# Patient Record
Sex: Female | Born: 1985 | Race: Black or African American | Hispanic: No | Marital: Single | Smoking: Never smoker
Health system: Southern US, Community
[De-identification: ages and names within clinical notes are randomized; demographics above are authoritative.]

---

## 2009-05-15 ENCOUNTER — Ambulatory Visit: Payer: Self-pay | Admitting: Obstetrics & Gynecology

## 2009-05-15 ENCOUNTER — Encounter: Payer: Self-pay | Admitting: Obstetrics & Gynecology

## 2009-05-16 ENCOUNTER — Encounter: Payer: Self-pay | Admitting: Obstetrics & Gynecology

## 2009-05-16 LAB — CONVERTED CEMR LAB

## 2009-05-21 ENCOUNTER — Ambulatory Visit: Payer: Self-pay | Admitting: Obstetrics & Gynecology

## 2009-05-21 LAB — CONVERTED CEMR LAB
ALT: 17 units/L (ref 0–35)
CO2: 19 meq/L (ref 19–32)
Calcium: 9 mg/dL (ref 8.4–10.5)
Chloride: 107 meq/L (ref 96–112)
Cholesterol: 128 mg/dL (ref 0–200)
Creatinine, Ser: 0.88 mg/dL (ref 0.40–1.20)
Glucose, Bld: 73 mg/dL (ref 70–99)
HCT: 39.1 % (ref 36.0–46.0)
Hemoglobin: 12.8 g/dL (ref 12.0–15.0)
Platelets: 259 10*3/uL (ref 150–400)
RBC: 4.23 M/uL (ref 3.87–5.11)
Total CHOL/HDL Ratio: 2.5
Triglycerides: 66 mg/dL (ref <150)
WBC: 7.4 10*3/uL (ref 4.0–10.5)

## 2009-07-18 ENCOUNTER — Ambulatory Visit: Payer: Self-pay | Admitting: Obstetrics & Gynecology

## 2009-07-24 ENCOUNTER — Ambulatory Visit: Payer: Self-pay | Admitting: Obstetrics & Gynecology

## 2009-10-29 ENCOUNTER — Ambulatory Visit: Payer: Self-pay | Admitting: Obstetrics and Gynecology

## 2011-01-07 NOTE — Assessment & Plan Note (Signed)
NAME:  Bailey Mendoza, Bailey Mendoza NO.:  [id number]   MEDICAL RECORD NO.:  [id number]          PATIENT TYPE:  POB   LOCATION:  [facility] at [facility]         FACILITY:  [facility]   PHYSICIAN:  Jaynie Collins, MD     DATE OF BIRTH:  [birth year]   DATE OF SERVICE:  05/15/2009                                  CLINIC NOTE   HISTORY OF PRESENT ILLNESS:  The patient is a 25 year old gravida 0, the  last menstrual period of April 25, 2009, who was here to initiate  gynecologic care and have her annual physical examination and Pap smear.  The patient does complain of having thick white vaginal discharge for  the last couple of weeks with bad odor and itching.  She denies any  dysuria, bleeding, fevers, or any other symptoms.  The patient denies  any history of sexually transmitted infection.  She is sexually active  with one female partner and uses condoms for contraception and sexually  transmitted infection prevention.  The patient is interested in starting  hormonal contraception and wants to discuss her options today.  She  denies any other gynecologic concerns.   PAST OBSTETRIC/GYNECOLOGIC HISTORY:  Gravida 0, the patient had menarche  at age 25.  She has regular menstrual cycles with 28 days in between  cycles and her periods last for 3 days with medium flow associated with  some moderate pain.  She denies any intramenstrual bleeding.  The  patient has had a Pap smear in March 2008, which was normal.  She denies  any history of abnormal Pap smear.  The patient has taken Ortho Tri-  Cyclen in the past for contraception, the last time was 7-8 months ago  and she did not have any reaction to this or any adverse reactions.  She  is interested in restarting on this birth control.  The patient has not  received the Gardasil vaccine series.   PAST MEDICAL HISTORY:  None.   PAST SURGICAL HISTORY:  None.   MEDICATIONS:  None.   ALLERGIES:  No known drug allergies.   SOCIAL HISTORY:   The patient lives with her friend.  She is currently a  Holiday representative at [employer].  She majors in sports, science and minors in business  administration.  She does not smoke, drink alcohol, or use any addicting  or illicit drugs.  She denies any past or current history of sexual,  physical, or emotional abuse.  The patient is sexually active with one  female partner and does not report any problems.   FAMILY HISTORY:  Remarkable for diabetes and high blood pressure.  She  denies any family history of cancers including other gynecologic  cancers, breast cancer, or colon cancer.   REVIEW OF SYSTEMS:  A 14-point comprehensive review of systems was  reviewed with the patient and was entirely negative.   PHYSICAL EXAMINATION:  VITAL SIGNS:  Pulse 81, blood pressure 127/83,  weight 170 pounds, height 5 feet 3 inches.  GENERAL:  Well-nourished female in no apparent distress.  HEENT:  Normocephalic, atraumatic.  NECK:  Supple.  No masses.  Normal thyroid.  LUNGS:  Clear to auscultation bilaterally.  HEART:  Regular  rate and rhythm.  BREASTS:  Soft, symmetric in size, nontender, no abnormal masses, skin  changes, nipple drainage, or lymphadenopathy noted.  ABDOMEN:  Soft, nontender, nondistended.  No organomegaly.  EXTREMITIES:  No cyanosis, clubbing, or edema, nontender.  PELVIC:  Normal external female genitalia and perineum.  Pink, well-  rugated vagina.  Small amount of thick white discharge was noted with  foul odor.  Sample was taken for a wet prep.  The patient has a  nulliparous cervix and Pap smear sample was obtained.  There was small  amount of spotting after the Pap smear, gonorrhea, and Chlamydia were  reflexively checked from the Pap smear.  On bimanual exam, the patient  had small retroverted uterus, mobile, nontender, and normal adnexa  without tenderness bilaterally.  The patient had a normal rectum on  exterior examination.   ASSESSMENT/PLAN:  The patient is a 25 year old gravida 0 here  for her  annual gynecologic physical examination, comes for evaluation of vaginal  discharge.  She had a normal breast examination.  We will follow up the  results of her Pap smear and will also followup results of her gonorrhea  and Chlamydia screen and her wet prep.  The patient is interested in  further serum STI screening including human immunodeficiency virus,  hepatitis B, and hepatitis C, and syphilis, but she also wants to do a  fasting lipid profile, so an appointment will be made for the patient to  come back when she is fasting to draw all these labs.  The patient was  given information about available contraceptive methods including oral  contraceptive pills, NuvaRing, Implanon, intrauterine device, and  importance of using condoms, or the barrier methods for sexually  transmitted infections was heavily stressed.  The patient was also  counseled extensively about human papillomavirus and the role of it in  the cervical dysplasia and Gardasil was recommended.  She was given  handouts for all contraception methods that were discussed and also for  the Gardasil vaccination.  The patient will review this information and  make a decision whether she wants to get the vaccination or not.  In the  meantime, she was given samples of Ortho-Tri-Cyclen Lo for 2 months  supply and told that if she has a lot of breakthrough bleeding with  this, she might be needed to be switched to full strength Ortho-Tri-  Cyclen Lo or to a monophasic pill.  We will monitor her response to  this.  The patient was told to call or come back in for any further  gynecologic concerns.           ______________________________  Jaynie Collins, MD     UA/MEDQ  D:  05/15/2009  T:  05/16/2009  Job:  [id number]

## 2011-01-07 NOTE — Assessment & Plan Note (Signed)
NAMEMORISSA, OBEIRNE NO.:  [id number]   MEDICAL RECORD NO.:  [id number]          PATIENT TYPE:  POB   LOCATION:  [facility] at [facility]         FACILITY:  [facility]   PHYSICIAN:  Jaynie Collins, MD     DATE OF BIRTH:  1986/03/04   DATE OF SERVICE:  07/24/2009                                  CLINIC NOTE   HISTORY OF PRESENT ILLNESS:  The patient comes to the office today for  insertion of Implanon.  The patient previously was using condoms for  birth control.  She is currently sexually active with one partner only.  She is currently on her menstrual cycle.  The patient had the consent  signed.  We did review the possible complications with irregular  bleeding with this patient.  She is aware of this.   PHYSICAL EXAMINATION:  VITAL SIGNS:  Blood pressure is 120/80, pulse is  80, weight is 167.   ALLERGIES:  No allergies.   PROCEDURE:  The patient was positioned, we will be using her right arm  as she is left handed.  Her right arm was cleaned with Betadine and  marked.  The area was numbed with 3 mL of lidocaine.  The Implanon was  inserted without any difficulty.  The patient was able to palpate the  rod as well as the Fredrich Cory able to palpate rod.  The incision was  cleaned and dressed and a Coban dressing was applied.   ASSESSMENT:  Contraception, Implanon inserted.   PLAN:  Following the Implanon insertion, we did review again the  possible complications of irregular menstrual cycle bleeding.  The  patient is advised to call the office if this becomes a difficulty.  She  was advised to watch the site for any signs of infection including  redness, swelling, tenderness.  The patient will change the bandage  after 3 days.  She will use backup method for 2 weeks.  She will return  to the clinic in 6 weeks for observation of the Implanon and site.  She  will return for her yearly exam in September.  She was advised to use  condoms with any partner change.      Remonia Richter, NP    ______________________________  Jaynie Collins, MD    LR/MEDQ  D:  07/24/2009  T:  07/25/2009  Job:  [id number]

## 2012-07-27 ENCOUNTER — Encounter: Payer: Self-pay | Admitting: Family Medicine

## 2012-07-27 ENCOUNTER — Ambulatory Visit (INDEPENDENT_AMBULATORY_CARE_PROVIDER_SITE_OTHER): Payer: 59 | Admitting: Family Medicine

## 2012-07-27 VITALS — BP 122/74 | HR 77 | Ht 62.0 in | Wt 180.0 lb

## 2012-07-27 DIAGNOSIS — IMO0001 Reserved for inherently not codable concepts without codable children: Secondary | ICD-10-CM

## 2012-07-27 DIAGNOSIS — Z30011 Encounter for initial prescription of contraceptive pills: Secondary | ICD-10-CM

## 2012-07-27 DIAGNOSIS — Z309 Encounter for contraceptive management, unspecified: Secondary | ICD-10-CM

## 2012-07-27 DIAGNOSIS — Z3046 Encounter for surveillance of implantable subdermal contraceptive: Secondary | ICD-10-CM

## 2012-07-27 DIAGNOSIS — Z3009 Encounter for other general counseling and advice on contraception: Secondary | ICD-10-CM

## 2012-07-27 MED ORDER — NORGESTIMATE-ETH ESTRADIOL 0.25-35 MG-MCG PO TABS: 1.0000 | ORAL_TABLET | Freq: Every day | ORAL | Status: DC

## 2012-07-27 NOTE — Progress Notes (Signed)
  Subjective:    Patient ID: Bailey Mendoza, female    DOB: [birth year], 26 y.o.   MRN: [id number]  HPI  Here today to have Nexplanon removed.  No complaints.  She is wanting to return to OC's.  Review of Systems  Constitutional: Negative for fever and chills.  Respiratory: Negative for shortness of breath.   Cardiovascular: Negative for chest pain.  Gastrointestinal: Negative for abdominal pain.  Psychiatric/Behavioral: Negative for dysphoric mood.       Objective:   Physical Exam  Vitals reviewed. Constitutional: She appears well-developed and well-nourished.  HENT:  Head: Normocephalic and atraumatic.  Eyes: No scleral icterus.  Neck: Neck supple.  Cardiovascular: Normal rate.   Pulmonary/Chest: Effort normal.  Abdominal: Soft. There is no tenderness.  Skin: Skin is warm and dry.  Psychiatric: She has a normal mood and affect.   Procedure: Patient given informed consent for removal of her Implanon, time out was performed.  Signed copy in the chart.  Appropriate time out taken. Implanon site identified.  Area prepped in usual sterile fashon. One cc of 1% lidocaine was used to anesthetize the area at the distal end of the implant. A small stab incision was made right beside the implant on the distal portion.  The implanon rod was grasped using hemostats and removed without difficulty.  There was less than 3 cc blood loss. There were no complications.  A small amount of antibiotic ointment and steri-strips were applied over the small incision.  A pressure bandage was applied to reduce any bruising.  The patient tolerated the procedure well and was given post procedure instructions.        Assessment & Plan:

## 2012-07-27 NOTE — Patient Instructions (Signed)

## 2013-04-28 ENCOUNTER — Encounter: Payer: Self-pay | Admitting: Obstetrics & Gynecology

## 2013-04-28 ENCOUNTER — Ambulatory Visit (INDEPENDENT_AMBULATORY_CARE_PROVIDER_SITE_OTHER): Payer: 59 | Admitting: Obstetrics & Gynecology

## 2013-04-28 VITALS — BP 119/81 | HR 71 | Ht 63.0 in | Wt 167.0 lb

## 2013-04-28 DIAGNOSIS — Z124 Encounter for screening for malignant neoplasm of cervix: Secondary | ICD-10-CM

## 2013-04-28 DIAGNOSIS — Z01419 Encounter for gynecological examination (general) (routine) without abnormal findings: Secondary | ICD-10-CM

## 2013-04-28 DIAGNOSIS — Z113 Encounter for screening for infections with a predominantly sexual mode of transmission: Secondary | ICD-10-CM

## 2013-04-28 DIAGNOSIS — Z309 Encounter for contraceptive management, unspecified: Secondary | ICD-10-CM

## 2013-04-28 DIAGNOSIS — Z0000 Encounter for general adult medical examination without abnormal findings: Secondary | ICD-10-CM

## 2013-04-28 LAB — CBC
MCH: 30.4 pg (ref 26.0–34.0)
MCHC: 33.7 g/dL (ref 30.0–36.0)
Platelets: 300 10*3/uL (ref 150–400)

## 2013-04-28 LAB — BASIC METABOLIC PANEL
Calcium: 9.1 mg/dL (ref 8.4–10.5)
Glucose, Bld: 89 mg/dL (ref 70–99)
Sodium: 137 mEq/L (ref 135–145)

## 2013-04-28 LAB — LIPID PANEL: HDL: 56 mg/dL (ref >39)

## 2013-04-28 LAB — TSH: TSH: 2.227 u[IU]/mL (ref 0.350–4.500)

## 2013-04-28 MED ORDER — NORGESTIMATE-ETH ESTRADIOL 0.25-35 MG-MCG PO TABS: 1.0000 | ORAL_TABLET | Freq: Every day | ORAL | Status: DC

## 2013-04-28 NOTE — Progress Notes (Signed)
  Subjective:     Bailey Mendoza is a 27 y.o. G0 female and is here for a comprehensive physical exam. The patient reports no problems. Wants refill of OCPs. Desires STI screening and routine health maintenance labs, she came in fasting today.  History   Social History  . Marital Status: Single    Spouse Name: N/A    Number of Children: N/A  . Years of Education: N/A   Occupational History  . Not on file.   Social History Main Topics  . Smoking status: Never Smoker   . Smokeless tobacco: Never Used  . Alcohol Use: Yes     Comment: social  . Drug Use: No  . Sexual Activity: Yes    Partners: Male    Birth Control/ Protection: Pill   Other Topics Concern  . Not on file   Social History Narrative  . No narrative on file   Health Maintenance  Topic Date Due  . Pap Smear  11/17/2004  . Tetanus/tdap  11/17/2005  . Influenza Vaccine  03/25/2013  She has received Gardasil series  The following portions of the patient's history were reviewed and updated as appropriate: allergies, current medications, past family history, past medical history, past social history, past surgical history and problem list.  Review of Systems A comprehensive review of systems was negative.   Objective:   BP 119/81  Pulse 71  Ht 5\' 3"  (1.6 m)  Wt 167 lb (75.751 kg)  BMI 29.59 kg/m2  LMP 04/07/2013 GENERAL: Well-developed, well-nourished female in no acute distress.  HEENT: Normocephalic, atraumatic. Sclerae anicteric.  NECK: Supple. Normal thyroid.  LUNGS: Clear to auscultation bilaterally.  HEART: Regular rate and rhythm. BREASTS: Symmetric in size. No masses, skin changes, nipple drainage, or lymphadenopathy. ABDOMEN: Soft, nontender, nondistended. No organomegaly. PELVIC: Normal external female genitalia. Vagina is pink and rugated.  Moderate amount of white vaginal discharge noted. Normal cervix contour. Pap smear obtained. Uterus is normal in size. No adnexal mass or tenderness.   EXTREMITIES: No cyanosis, clubbing, or edema, 2+ distal pulses.   Assessment:    Healthy female exam Contraceptive management STI screening and routine health maintenance labs   Plan:   Pap, STI screen, health maintenance labs done, will follow up results and manage accordingly. OCPs refilled, sent to [facility] mail order pharmacy as per patient request Routine preventative health maintenance measures emphasized

## 2013-04-28 NOTE — Patient Instructions (Signed)
Preventive Care for Adults, Female A healthy lifestyle and preventive care can promote health and wellness. Preventive health guidelines for women include the following key practices.  A routine yearly physical is a good way to check with your caregiver about your health and preventive screening. It is a chance to share any concerns and updates on your health, and to receive a thorough exam.  Visit your dentist for a routine exam and preventive care every 6 months. Brush your teeth twice a day and floss once a day. Good oral hygiene prevents tooth decay and gum disease.  The frequency of eye exams is based on your age, health, family medical history, use of contact lenses, and other factors. Follow your caregiver's recommendations for frequency of eye exams.  Eat a healthy diet. Foods like vegetables, fruits, whole grains, low-fat dairy products, and lean protein foods contain the nutrients you need without too many calories. Decrease your intake of foods high in solid fats, added sugars, and salt. Eat the right amount of calories for you.Get information about a proper diet from your caregiver, if necessary.  Regular physical exercise is one of the most important things you can do for your health. Most adults should get at least 150 minutes of moderate-intensity exercise (any activity that increases your heart rate and causes you to sweat) each week. In addition, most adults need muscle-strengthening exercises on 2 or more days a week.  Maintain a healthy weight. The body mass index (BMI) is a screening tool to identify possible weight problems. It provides an estimate of body fat based on height and weight. Your caregiver can help determine your BMI, and can help you achieve or maintain a healthy weight.For adults 20 years and older:  A BMI below 18.5 is considered underweight.  A BMI of 18.5 to 24.9 is normal.  A BMI of 25 to 29.9 is considered overweight.  A BMI of 30 and above is  considered obese.  Maintain normal blood lipids and cholesterol levels by exercising and minimizing your intake of saturated fat. Eat a balanced diet with plenty of fruit and vegetables. Blood tests for lipids and cholesterol should begin at age 41 and be repeated every 5 years. If your lipid or cholesterol levels are high, you are over 50, or you are at high risk for heart disease, you may need your cholesterol levels checked more frequently.Ongoing high lipid and cholesterol levels should be treated with medicines if diet and exercise are not effective.  If you smoke, find out from your caregiver how to quit. If you do not use tobacco, do not start.  If you are pregnant, do not drink alcohol. If you are breastfeeding, be very cautious about drinking alcohol. If you are not pregnant and choose to drink alcohol, do not exceed 1 drink per day. One drink is considered to be 12 ounces (355 mL) of beer, 5 ounces (148 mL) of wine, or 1.5 ounces (44 mL) of liquor.  Avoid use of street drugs. Do not share needles with anyone. Ask for help if you need support or instructions about stopping the use of drugs.  High blood pressure causes heart disease and increases the risk of stroke. Your blood pressure should be checked at least every 1 to 2 years. Ongoing high blood pressure should be treated with medicines if weight loss and exercise are not effective.  If you are 65 to 26 years old, ask your caregiver if you should take aspirin to prevent strokes.  Diabetes  screening involves taking a blood sample to check your fasting blood sugar level. This should be done once every 3 years, after age 45, if you are within normal weight and without risk factors for diabetes. Testing should be considered at a younger age or be carried out more frequently if you are overweight and have at least 1 risk factor for diabetes.  Breast cancer screening is essential preventive care for women. You should practice "breast  self-awareness." This means understanding the normal appearance and feel of your breasts and may include breast self-examination. Any changes detected, no matter how small, should be reported to a caregiver. Women in their 20s and 30s should have a clinical breast exam (CBE) by a caregiver as part of a regular health exam every 1 to 3 years. After age 40, women should have a CBE every year. Starting at age 40, women should consider having a mammography (breast X-ray test) every year. Women who have a family history of breast cancer should talk to their caregiver about genetic screening. Women at a high risk of breast cancer should talk to their caregivers about having magnetic resonance imaging (MRI) and a mammography every year.  The Pap test is a screening test for cervical cancer. A Pap test can show cell changes on the cervix that might become cervical cancer if left untreated. A Pap test is a procedure in which cells are obtained and examined from the lower end of the uterus (cervix).  Women should have a Pap test starting at age 21.  Between ages 21 and 29, Pap tests should be repeated every 2 years.  Beginning at age 30, you should have a Pap test every 3 years as long as the past 3 Pap tests have been normal.  Some women have medical problems that increase the chance of getting cervical cancer. Talk to your caregiver about these problems. It is especially important to talk to your caregiver if a new problem develops soon after your last Pap test. In these cases, your caregiver may recommend more frequent screening and Pap tests.  The above recommendations are the same for women who have or have not gotten the vaccine for human papillomavirus (HPV).  If you had a hysterectomy for a problem that was not cancer or a condition that could lead to cancer, then you no longer need Pap tests. Even if you no longer need a Pap test, a regular exam is a good idea to make sure no other problems are  starting.  If you are between ages 65 and 70, and you have had normal Pap tests going back 10 years, you no longer need Pap tests. Even if you no longer need a Pap test, a regular exam is a good idea to make sure no other problems are starting.  If you have had past treatment for cervical cancer or a condition that could lead to cancer, you need Pap tests and screening for cancer for at least 20 years after your treatment.  If Pap tests have been discontinued, risk factors (such as a new sexual partner) need to be reassessed to determine if screening should be resumed.  The HPV test is an additional test that may be used for cervical cancer screening. The HPV test looks for the virus that can cause the cell changes on the cervix. The cells collected during the Pap test can be tested for HPV. The HPV test could be used to screen women aged 30 years and older, and should   be used in women of any age who have unclear Pap test results. After the age of 30, women should have HPV testing at the same frequency as a Pap test.  Colorectal cancer can be detected and often prevented. Most routine colorectal cancer screening begins at the age of 50 and continues through age 75. However, your caregiver may recommend screening at an earlier age if you have risk factors for colon cancer. On a yearly basis, your caregiver may provide home test kits to check for hidden blood in the stool. Use of a small camera at the end of a tube, to directly examine the colon (sigmoidoscopy or colonoscopy), can detect the earliest forms of colorectal cancer. Talk to your caregiver about this at age 50, when routine screening begins. Direct examination of the colon should be repeated every 5 to 10 years through age 75, unless early forms of pre-cancerous polyps or small growths are found.  Hepatitis C blood testing is recommended for all people born from 1945 through 1965 and any individual with known risks for hepatitis C.  Practice  safe sex. Use condoms and avoid high-risk sexual practices to reduce the spread of sexually transmitted infections (STIs). STIs include gonorrhea, chlamydia, syphilis, trichomonas, herpes, HPV, and human immunodeficiency virus (HIV). Herpes, HIV, and HPV are viral illnesses that have no cure. They can result in disability, cancer, and death. Sexually active women aged 25 and younger should be checked for chlamydia. Older women with new or multiple partners should also be tested for chlamydia. Testing for other STIs is recommended if you are sexually active and at increased risk.  Osteoporosis is a disease in which the bones lose minerals and strength with aging. This can result in serious bone fractures. The risk of osteoporosis can be identified using a bone density scan. Women ages 65 and over and women at risk for fractures or osteoporosis should discuss screening with their caregivers. Ask your caregiver whether you should take a calcium supplement or vitamin D to reduce the rate of osteoporosis.  Menopause can be associated with physical symptoms and risks. Hormone replacement therapy is available to decrease symptoms and risks. You should talk to your caregiver about whether hormone replacement therapy is right for you.  Use sunscreen with sun protection factor (SPF) of 30 or more. Apply sunscreen liberally and repeatedly throughout the day. You should seek shade when your shadow is shorter than you. Protect yourself by wearing long sleeves, pants, a wide-brimmed hat, and sunglasses year round, whenever you are outdoors.  Once a month, do a whole body skin exam, using a mirror to look at the skin on your back. Notify your caregiver of new moles, moles that have irregular borders, moles that are larger than a pencil eraser, or moles that have changed in shape or color.  Stay current with required immunizations.  Influenza. You need a dose every fall (or winter). The composition of the flu vaccine  changes each year, so being vaccinated once is not enough.  Pneumococcal polysaccharide. You need 1 to 2 doses if you smoke cigarettes or if you have certain chronic medical conditions. You need 1 dose at age 65 (or older) if you have never been vaccinated.  Tetanus, diphtheria, pertussis (Tdap, Td). Get 1 dose of Tdap vaccine if you are younger than age 65, are over 65 and have contact with an infant, are a healthcare worker, are pregnant, or simply want to be protected from whooping cough. After that, you need a Td   booster dose every 10 years. Consult your caregiver if you have not had at least 3 tetanus and diphtheria-containing shots sometime in your life or have a deep or dirty wound.  HPV. You need this vaccine if you are a woman age 26 or younger. The vaccine is given in 3 doses over 6 months.  Measles, mumps, rubella (MMR). You need at least 1 dose of MMR if you were born in 1957 or later. You may also need a second dose.  Meningococcal. If you are age 19 to 21 and a first-year college student living in a residence hall, or have one of several medical conditions, you need to get vaccinated against meningococcal disease. You may also need additional booster doses.  Zoster (shingles). If you are age 60 or older, you should get this vaccine.  Varicella (chickenpox). If you have never had chickenpox or you were vaccinated but received only 1 dose, talk to your caregiver to find out if you need this vaccine.  Hepatitis A. You need this vaccine if you have a specific risk factor for hepatitis A virus infection or you simply wish to be protected from this disease. The vaccine is usually given as 2 doses, 6 to 18 months apart.  Hepatitis B. You need this vaccine if you have a specific risk factor for hepatitis B virus infection or you simply wish to be protected from this disease. The vaccine is given in 3 doses, usually over 6 months. Preventive Services / Frequency Ages 19 to 39  Blood  pressure check.** / Every 1 to 2 years.  Lipid and cholesterol check.** / Every 5 years beginning at age 20.  Clinical breast exam.** / Every 3 years for women in their 20s and 30s.  Pap test.** / Every 2 years from ages 21 through 29. Every 3 years starting at age 30 through age 65 or 70 with a history of 3 consecutive normal Pap tests.  HPV screening.** / Every 3 years from ages 30 through ages 65 to 70 with a history of 3 consecutive normal Pap tests.  Hepatitis C blood test.** / For any individual with known risks for hepatitis C.  Skin self-exam. / Monthly.  Influenza immunization.** / Every year.  Pneumococcal polysaccharide immunization.** / 1 to 2 doses if you smoke cigarettes or if you have certain chronic medical conditions.  Tetanus, diphtheria, pertussis (Tdap, Td) immunization. / A one-time dose of Tdap vaccine. After that, you need a Td booster dose every 10 years.  HPV immunization. / 3 doses over 6 months, if you are 26 and younger.  Measles, mumps, rubella (MMR) immunization. / You need at least 1 dose of MMR if you were born in 1957 or later. You may also need a second dose.  Meningococcal immunization. / 1 dose if you are age 19 to 21 and a first-year college student living in a residence hall, or have one of several medical conditions, you need to get vaccinated against meningococcal disease. You may also need additional booster doses.  Varicella immunization.** / Consult your caregiver.  Hepatitis A immunization.** / Consult your caregiver. 2 doses, 6 to 18 months apart.  Hepatitis B immunization.** / Consult your caregiver. 3 doses usually over 6 months. Ages 40 to 64  Blood pressure check.** / Every 1 to 2 years.  Lipid and cholesterol check.** / Every 5 years beginning at age 20.  Clinical breast exam.** / Every year after age 40.  Mammogram.** / Every year beginning at age 40   and continuing for as long as you are in good health. Consult with your  caregiver.  Pap test.** / Every 3 years starting at age 30 through age 65 or 70 with a history of 3 consecutive normal Pap tests.  HPV screening.** / Every 3 years from ages 30 through ages 65 to 70 with a history of 3 consecutive normal Pap tests.  Fecal occult blood test (FOBT) of stool. / Every year beginning at age 50 and continuing until age 75. You may not need to do this test if you get a colonoscopy every 10 years.  Flexible sigmoidoscopy or colonoscopy.** / Every 5 years for a flexible sigmoidoscopy or every 10 years for a colonoscopy beginning at age 50 and continuing until age 75.  Hepatitis C blood test.** / For all people born from 1945 through 1965 and any individual with known risks for hepatitis C.  Skin self-exam. / Monthly.  Influenza immunization.** / Every year.  Pneumococcal polysaccharide immunization.** / 1 to 2 doses if you smoke cigarettes or if you have certain chronic medical conditions.  Tetanus, diphtheria, pertussis (Tdap, Td) immunization.** / A one-time dose of Tdap vaccine. After that, you need a Td booster dose every 10 years.  Measles, mumps, rubella (MMR) immunization. / You need at least 1 dose of MMR if you were born in 1957 or later. You may also need a second dose.  Varicella immunization.** / Consult your caregiver.  Meningococcal immunization.** / Consult your caregiver.  Hepatitis A immunization.** / Consult your caregiver. 2 doses, 6 to 18 months apart.  Hepatitis B immunization.** / Consult your caregiver. 3 doses, usually over 6 months. Ages 65 and over  Blood pressure check.** / Every 1 to 2 years.  Lipid and cholesterol check.** / Every 5 years beginning at age 20.  Clinical breast exam.** / Every year after age 40.  Mammogram.** / Every year beginning at age 40 and continuing for as long as you are in good health. Consult with your caregiver.  Pap test.** / Every 3 years starting at age 30 through age 65 or 70 with a 3  consecutive normal Pap tests. Testing can be stopped between 65 and 70 with 3 consecutive normal Pap tests and no abnormal Pap or HPV tests in the past 10 years.  HPV screening.** / Every 3 years from ages 30 through ages 65 or 70 with a history of 3 consecutive normal Pap tests. Testing can be stopped between 65 and 70 with 3 consecutive normal Pap tests and no abnormal Pap or HPV tests in the past 10 years.  Fecal occult blood test (FOBT) of stool. / Every year beginning at age 50 and continuing until age 75. You may not need to do this test if you get a colonoscopy every 10 years.  Flexible sigmoidoscopy or colonoscopy.** / Every 5 years for a flexible sigmoidoscopy or every 10 years for a colonoscopy beginning at age 50 and continuing until age 75.  Hepatitis C blood test.** / For all people born from 1945 through 1965 and any individual with known risks for hepatitis C.  Osteoporosis screening.** / A one-time screening for women ages 65 and over and women at risk for fractures or osteoporosis.  Skin self-exam. / Monthly.  Influenza immunization.** / Every year.  Pneumococcal polysaccharide immunization.** / 1 dose at age 65 (or older) if you have never been vaccinated.  Tetanus, diphtheria, pertussis (Tdap, Td) immunization. / A one-time dose of Tdap vaccine if you are over   65 and have contact with an infant, are a Research scientist (physical sciences), or simply want to be protected from whooping cough. After that, you need a Td booster dose every 10 years.  Varicella immunization.** / Consult your caregiver.  Meningococcal immunization.** / Consult your caregiver.  Hepatitis A immunization.** / Consult your caregiver. 2 doses, 6 to 18 months apart.  Hepatitis B immunization.** / Check with your caregiver. 3 doses, usually over 6 months. ** Family history and personal history of risk and conditions may change your caregiver's recommendations. Document Released: 10/07/2001 Document Revised: 11/03/2011  Document Reviewed: 01/06/2011 [facility] Patient Information 2014 [place], [place].  Thank you for enrolling in MyChart. Please follow the instructions below to securely access your online medical record. MyChart allows you to send messages to your doctor, view your test results, manage appointments, and more.   How Do I Sign Up? 1. In your Internet browser, go to [employer] and enter [email or web address]. 2. Click on the Sign Up Now link in the Sign In box. You will see the New Member Sign Up page. 3. Enter your MyChart Access Code exactly as it appears below. You will not need to use this code after you've completed the sign-up process. If you do not sign up before the expiration date, you must request a new code. MyChart Access Code: [email or web address] Expires: 05/28/2013  8:38 AM  4. Enter your Social Security Number ([id number]) and Date of Birth (mm/dd/yyyy) as indicated and click Submit. You will be taken to the next sign-up page. 5. Create a MyChart ID. This will be your MyChart login ID and cannot be changed, so think of one that is secure and easy to remember. 6. Create a MyChart password. You can change your password at any time. 7. Enter your Password Reset Question and Answer. This can be used at a later time if you forget your password.  8. Enter your e-mail address. You will receive e-mail notification when new information is available in MyChart. 9. Click Sign Up. You can now view your medical record.   Additional Information Remember, MyChart is NOT to be used for urgent needs. For medical emergencies, dial 911.

## 2013-04-29 LAB — HIV ANTIBODY (ROUTINE TESTING W REFLEX): HIV: NONREACTIVE

## 2013-04-29 LAB — HEPATITIS C ANTIBODY: HCV Ab: NEGATIVE

## 2013-05-02 ENCOUNTER — Telehealth: Payer: Self-pay | Admitting: *Deleted

## 2013-05-02 NOTE — Telephone Encounter (Signed)
Message copied by Grayland Ormond on Mon May 02, 2013  8:52 AM ------      Message from: Jaynie Collins A      Created: Fri Apr 29, 2013  6:16 PM       Normal pap, negative comprehensive STI screen and normal TSH, CBC and lipid panel. Continue cervical cancer screening and routine preventative health maintenance measures as recommended. Please call to inform patient of results and recommendations.       ------

## 2013-05-02 NOTE — Telephone Encounter (Signed)
Pt aware.

## 2013-06-20 ENCOUNTER — Emergency Department (HOSPITAL_COMMUNITY)
Admission: EM | Admit: 2013-06-20 | Discharge: 2013-06-20 | Disposition: A | Discharge status: Home / Self Care | Payer: 59 | Attending: Emergency Medicine | Admitting: Emergency Medicine

## 2013-06-20 ENCOUNTER — Encounter (HOSPITAL_COMMUNITY): Payer: Self-pay | Admitting: Emergency Medicine

## 2013-06-20 ENCOUNTER — Emergency Department (HOSPITAL_COMMUNITY): Payer: 59

## 2013-06-20 DIAGNOSIS — J069 Acute upper respiratory infection, unspecified: Secondary | ICD-10-CM | POA: Insufficient documentation

## 2013-06-20 DIAGNOSIS — Z79899 Other long term (current) drug therapy: Secondary | ICD-10-CM | POA: Insufficient documentation

## 2013-06-20 DIAGNOSIS — R0602 Shortness of breath: Secondary | ICD-10-CM | POA: Insufficient documentation

## 2013-06-20 MED ORDER — HYDROCOD POLST-CHLORPHEN POLST 10-8 MG/5ML PO LQCR: 5.0000 mL | Freq: Two times a day (BID) | ORAL | Status: DC | PRN

## 2013-06-20 NOTE — ED Notes (Signed)
Pt presents with c/o cough x 2 weeks 

## 2013-06-20 NOTE — ED Provider Notes (Signed)
CSN: [id number]     Arrival date & time 06/20/13  1626 History  This chart was scribed for non-physician practitioner Santiago Glad, PA-C working with Suzi Roots, MD by Valera Castle, ED scribe. This patient was seen in room WTR5/WTR5 and the patient's care was started at 4:47 PM.     Chief Complaint  Patient presents with  . Cough    The history is provided by the patient. No language interpreter was used.   HPI Comments: Bailey Mendoza is a 27 y.o. female who presents to the Emergency Department complaining of recurrent, moderate, intermittent, unproductive cough, with associated SOB, onset 2 days ago. She states she was sick a couple of weeks ago with the same symptoms, and having taken over counter medication with initial relief, but that the symptoms have returned. She wonders if the symptoms have returned from being outside in the cold. She denies h/o asthma. She denies fever, congestion, ear pain, sinus pain, and any other associated symptoms. She denies h/o smoking.   History reviewed. No pertinent past medical history. History reviewed. No pertinent past surgical history. Family History  Problem Relation Age of Onset  . Hypertension Mother   . Diabetes Maternal Grandfather   . Diabetes Paternal Grandfather    History  Substance Use Topics  . Smoking status: Never Smoker   . Smokeless tobacco: Never Used  . Alcohol Use: Yes     Comment: social   OB History   Grav Para Term Preterm Abortions TAB SAB Ect Mult Living   0 0 0 0 0 0 0 0 0 0      Review of Systems  Constitutional: Negative for fever.  HENT: Negative for congestion and ear pain.   Respiratory: Positive for cough and shortness of breath.   All other systems reviewed and are negative.    Allergies  Review of patient's allergies indicates no known allergies.  Home Medications   Current Outpatient Rx  Name  Route  Sig  Dispense  Refill  . norgestimate-ethinyl estradiol  (ORTHO-CYCLEN,SPRINTEC,PREVIFEM) 0.25-35 MG-MCG tablet   Oral   Take 1 tablet by mouth daily.   3 Package   5    Triage Vitals: BP 120/80  Pulse 78  Temp(Src) 98.3 F (36.8 C) (Oral)  Resp 20  Ht 5\' 2"  (1.575 m)  Wt 170 lb (77.111 kg)  BMI 31.09 kg/m2  SpO2 99%  LMP 05/18/2013  Physical Exam  Nursing note and vitals reviewed. Constitutional: She is oriented to person, place, and time. She appears well-developed and well-nourished. No distress.  HENT:  Head: Normocephalic and atraumatic.  Right Ear: Tympanic membrane, external ear and ear canal normal.  Left Ear: Tympanic membrane, external ear and ear canal normal.  Nose: Nose normal.  Mouth/Throat: Oropharynx is clear and moist.  Eyes: EOM are normal.  Neck: Neck supple. No tracheal deviation present.  Cardiovascular: Normal rate, regular rhythm and normal heart sounds.   Pulmonary/Chest: Effort normal and breath sounds normal. No respiratory distress.  Musculoskeletal: Normal range of motion.  Neurological: She is alert and oriented to person, place, and time.  Skin: Skin is warm and dry.  Psychiatric: She has a normal mood and affect. Her behavior is normal.    ED Course  Procedures (including critical care time)  DIAGNOSTIC STUDIES: Oxygen Saturation is 99% on room air, normal by my interpretation.    COORDINATION OF CARE: 4:52 PM-Discussed treatment plan which includes CXR with pt at bedside and pt agreed to plan.  Labs Review Labs Reviewed - No data to display Imaging Review Dg Chest 2 View  06/20/2013   CLINICAL DATA:  Cough, shortness of breath  EXAM: CHEST  2 VIEW  COMPARISON:  None.  FINDINGS: The heart size and mediastinal contours are within normal limits. Both lungs are clear. The visualized skeletal structures are unremarkable.  IMPRESSION: No active cardiopulmonary disease.   Electronically Signed   By: Sherian Rein M.D.   On: 06/20/2013 17:15    EKG Interpretation   None      No orders of  the defined types were placed in this encounter.    MDM  No diagnosis found. Pt CXR negative for acute infiltrate. Patients symptoms are consistent with URI, likely viral etiology. Discussed that antibiotics are not indicated for viral infections. Pt will be discharged with symptomatic treatment.  Verbalizes understanding and is agreeable with plan. Pt is hemodynamically stable & in NAD prior to dc.  I personally performed the services described in this documentation, which was scribed in my presence. The recorded information has been reviewed and is accurate.    Santiago Glad, PA-C 06/20/13 1734

## 2013-06-21 NOTE — ED Provider Notes (Signed)
Medical screening examination/treatment/procedure(s) were performed by non-physician practitioner and as supervising physician I was immediately available for consultation/collaboration.  EKG Interpretation   None         Shalaina Guardiola E Modupe Shampine, MD 06/21/13 1244 

## 2014-05-09 ENCOUNTER — Other Ambulatory Visit: Payer: Self-pay | Admitting: Obstetrics & Gynecology

## 2014-05-18 ENCOUNTER — Encounter: Payer: Self-pay | Admitting: Obstetrics & Gynecology

## 2014-05-18 ENCOUNTER — Ambulatory Visit (INDEPENDENT_AMBULATORY_CARE_PROVIDER_SITE_OTHER): Payer: 59 | Admitting: Obstetrics & Gynecology

## 2014-05-18 VITALS — BP 124/76 | HR 63 | Ht 63.0 in | Wt 178.2 lb

## 2014-05-18 DIAGNOSIS — Z01419 Encounter for gynecological examination (general) (routine) without abnormal findings: Secondary | ICD-10-CM

## 2014-05-18 DIAGNOSIS — Z113 Encounter for screening for infections with a predominantly sexual mode of transmission: Secondary | ICD-10-CM

## 2014-05-18 DIAGNOSIS — N611 Abscess of the breast and nipple: Secondary | ICD-10-CM

## 2014-05-18 DIAGNOSIS — B354 Tinea corporis: Secondary | ICD-10-CM

## 2014-05-18 DIAGNOSIS — N61 Mastitis without abscess: Secondary | ICD-10-CM

## 2014-05-18 DIAGNOSIS — Z124 Encounter for screening for malignant neoplasm of cervix: Secondary | ICD-10-CM

## 2014-05-18 MED ORDER — CEPHALEXIN 500 MG PO CAPS: 500.0000 mg | ORAL_CAPSULE | Freq: Four times a day (QID) | ORAL | Status: DC

## 2014-05-18 NOTE — Progress Notes (Signed)
Patient is here for yearly exam.  She noticed a lump or knot on her right breast/chest area on Monday, it is sore to the touch and getting more sore by the day.

## 2014-05-18 NOTE — Patient Instructions (Signed)
Tinea Versicolor Tinea versicolor is a common yeast infection of the skin. This condition becomes known when the yeast on our skin starts to overgrow (yeast is a normal inhabitant on our skin). This condition is noticed as white or light brown patches on brown skin, and is more evident in the summer on tanned skin. These areas are slightly scaly if scratched. The light patches from the yeast become evident when the yeast creates "holes in your suntan". This is most often noticed in the summer. The patches are usually located on the chest, back, pubis, neck and body folds. However, it may occur on any area of body. Mild itching and inflammation (redness or soreness) may be present. DIAGNOSIS  The diagnosisof this is made clinically (by looking). Cultures from samples are usually not needed. Examination under the microscope may help. However, yeast is normally found on skin. The diagnosis still remains clinical. Examination under Wood's Ultraviolet Light can determine the extent of the infection. TREATMENT  This common infection is usually only of cosmetic (only a concern to your appearance). It is easily treated with dandruff shampoo used during showers or bathing. Vigorous scrubbing will eliminate the yeast over several days time. The light areas in your skin may remain for weeks or months after the infection is cured unless your skin is exposed to sunlight. The lighter or darker spots caused by the fungus that remain after complete treatment are not a sign of treatment failure; it will take a long time to resolve. Your caregiver may recommend a number of commercial preparations or medication by mouth if home care is not working. Recurrence is common and preventative medication may be necessary. This skin condition is not highly contagious. Special care is not needed to protect close friends and family members. Normal hygiene is usually enough. Follow up is required only if you develop complications (such as a  secondary infection from scratching), if recommended by your caregiver, or if no relief is obtained from the preparations used. Document Released: 08/08/2000 Document Revised: 11/03/2011 Document Reviewed: 09/20/2008 [employer] Patient Information 2015 [employer]. This information is not intended to replace advice given to you by your health care provider. Make sure you discuss any questions you have with your health care provider.  

## 2014-05-18 NOTE — Progress Notes (Signed)
Patient ID: Bailey Mendoza, female   DOB: [birth year], 29 y.o.   MRN: [id number] Subjective:     Bailey Mendoza is a 28 y.o. female here for a routine exam.  Current complaints: painful lump in right breast that started 3-4 days prev and is getting more uncomfortable. She denies other breast sx. When asked about pts skin changes she reports that it has been that way for months and she thought it was due normal as her father had it as well.    Gynecologic History Patient's last menstrual period was 04/17/2014. Contraception: OCP (estrogen/progesterone) Last Pap: 04/2012. Results were: normal Last mammogram: n/a.  Obstetric History OB History  Gravida Para Term Preterm AB SAB TAB Ectopic Multiple Living  0 0 0 0 0 0 0 0 0 0        History reviewed. No pertinent past medical history. History reviewed. No pertinent past surgical history. Current Outpatient Prescriptions on File Prior to Visit  Medication Sig Dispense Refill  . SPRINTEC 28 0.25-35 MG-MCG tablet TAKE 1 TABLET DAILY  3 tablet  4   No current facility-administered medications on file prior to visit.  No Known Allergies    The following portions of the patient's history were reviewed and updated as appropriate: allergies, current medications, past family history, past medical history, past social history, past surgical history and problem list.  Review of Systems Pertinent items are noted in HPI.    Objective:    BP 124/76  Pulse 63  Ht 5\' 3"  (1.6 m)  Wt 178 lb 3.2 oz (80.831 kg)  BMI 31.57 kg/m2  LMP 04/17/2014  General Appearance:    Alert, cooperative, no distress, appears stated age  Head:    Normocephalic, without obvious abnormality, atraumatic  Eyes:    PERRL, conjunctiva/corneas clear, EOM's intact, fundi    benign, both eyes  Ears:    Normal TM's and external ear canals, both ears  Nose:   Nares normal, septum midline, mucosa normal, no drainage    or sinus tenderness  Throat:   Lips, mucosa, and  tongue normal; teeth and gums normal  Neck:   Supple, symmetrical, trachea midline, no adenopathy;    thyroid:  no enlargement/tenderness/nodules; no carotid   bruit or JVD  Back:     Symmetric, no curvature, ROM normal, no CVA tenderness  Lungs:     Clear to auscultation bilaterally, respirations unlabored  Chest Wall:    No tenderness or deformity   Heart:    Regular rate and rhythm, S1 and S2 normal, no murmur, rub   or gallop  Breast Exam:    Right breast just lateral to the sternum there is a 3 cm abscess.  It is tender and erythematous but, not pointing. Left breast no tenderness, masses, or nipple abnormality  Abdomen:     Soft, non-tender, bowel sounds active all four quadrants,    no masses, no organomegaly  Genitalia:    Normal female without lesion, discharge or tenderness; uterus small and mobile     Extremities:   Extremities normal, atraumatic, no cyanosis or edema  Pulses:   2+ and symmetric all extremities  Skin:   Skin color, texture, turgor normal. There is a hypopigmented rash around the pts bra lines and around her chest.  There are some areas of excoriations in the midline and some satellite lesions noted as well            Assessment:    Healthy female exam.  Abscess  on right breast Tinea corporis   Plan:    Follow up in: 1 year.   Keflex 500mg  qid x 7days Referral to Dermatology for tinea F/u PAP and cx rec heat compresses to breast. Pt counseled to f/u if sx worsened or it the lesion begins to 'point' or if she develops f/c

## 2014-05-22 LAB — CYTOLOGY - PAP

## 2015-06-09 ENCOUNTER — Other Ambulatory Visit: Payer: Self-pay | Admitting: Obstetrics & Gynecology

## 2015-11-19 ENCOUNTER — Telehealth: Payer: Self-pay | Admitting: *Deleted

## 2015-11-19 DIAGNOSIS — Z3041 Encounter for surveillance of contraceptive pills: Secondary | ICD-10-CM

## 2015-11-19 MED ORDER — NORGESTIMATE-ETH ESTRADIOL 0.25-35 MG-MCG PO TABS: 1.0000 | ORAL_TABLET | Freq: Every day | ORAL | Status: DC

## 2015-11-19 NOTE — Telephone Encounter (Signed)
-----   Message from Bailey Mendoza sent at 11/19/2015  3:58 PM EDT ----- Regarding: Birth Control Refill Needs her [facility], her ins info has changed (collected her new ins info and added it to her chart). She states she stopped receiving her pills because of that

## 2015-11-19 NOTE — Telephone Encounter (Signed)
[facility] one refill to [facility], pt will need to schedule annual exam. Informed pt via voicemail to call office back and schedule appt.

## 2016-01-21 ENCOUNTER — Other Ambulatory Visit: Payer: Self-pay | Admitting: Obstetrics & Gynecology

## 2016-04-10 ENCOUNTER — Encounter: Payer: Self-pay | Admitting: Obstetrics & Gynecology

## 2016-04-10 ENCOUNTER — Ambulatory Visit (INDEPENDENT_AMBULATORY_CARE_PROVIDER_SITE_OTHER): Payer: Managed Care, Other (non HMO) | Admitting: Obstetrics & Gynecology

## 2016-04-10 VITALS — BP 121/81 | HR 79 | Ht 63.0 in | Wt 187.0 lb

## 2016-04-10 DIAGNOSIS — Z01419 Encounter for gynecological examination (general) (routine) without abnormal findings: Secondary | ICD-10-CM

## 2016-04-10 DIAGNOSIS — Z124 Encounter for screening for malignant neoplasm of cervix: Secondary | ICD-10-CM | POA: Diagnosis not present

## 2016-04-10 DIAGNOSIS — Z113 Encounter for screening for infections with a predominantly sexual mode of transmission: Secondary | ICD-10-CM

## 2016-04-10 NOTE — Patient Instructions (Signed)
Preventive Care for Adults, Female A healthy lifestyle and preventive care can promote health and wellness. Preventive health guidelines for women include the following key practices.  A routine yearly physical is a good way to check with your health care provider about your health and preventive screening. It is a chance to share any concerns and updates on your health and to receive a thorough exam.  Visit your dentist for a routine exam and preventive care every 6 months. Brush your teeth twice a day and floss once a day. Good oral hygiene prevents tooth decay and gum disease.  The frequency of eye exams is based on your age, health, family medical history, use of contact lenses, and other factors. Follow your health care provider's recommendations for frequency of eye exams.  Eat a healthy diet. Foods like vegetables, fruits, whole grains, low-fat dairy products, and lean protein foods contain the nutrients you need without too many calories. Decrease your intake of foods high in solid fats, added sugars, and salt. Eat the right amount of calories for you.Get information about a proper diet from your health care provider, if necessary.  Regular physical exercise is one of the most important things you can do for your health. Most adults should get at least 150 minutes of moderate-intensity exercise (any activity that increases your heart rate and causes you to sweat) each week. In addition, most adults need muscle-strengthening exercises on 2 or more days a week.  Maintain a healthy weight. The body mass index (BMI) is a screening tool to identify possible weight problems. It provides an estimate of body fat based on height and weight. Your health care provider can find your BMI and can help you achieve or maintain a healthy weight.For adults 20 years and older:  A BMI below 18.5 is considered underweight.  A BMI of 18.5 to 24.9 is normal.  A BMI of 25 to 29.9 is considered overweight.  A  BMI of 30 and above is considered obese.  Maintain normal blood lipids and cholesterol levels by exercising and minimizing your intake of saturated fat. Eat a balanced diet with plenty of fruit and vegetables. Blood tests for lipids and cholesterol should begin at age 45 and be repeated every 5 years. If your lipid or cholesterol levels are high, you are over 50, or you are at high risk for heart disease, you may need your cholesterol levels checked more frequently.Ongoing high lipid and cholesterol levels should be treated with medicines if diet and exercise are not working.  If you smoke, find out from your health care provider how to quit. If you do not use tobacco, do not start.  Lung cancer screening is recommended for adults aged 45-80 years who are at high risk for developing lung cancer because of a history of smoking. A yearly low-dose CT scan of the lungs is recommended for people who have at least a 30-pack-year history of smoking and are a current smoker or have quit within the past 15 years. A pack year of smoking is smoking an average of 1 pack of cigarettes a day for 1 year (for example: 1 pack a day for 30 years or 2 packs a day for 15 years). Yearly screening should continue until the smoker has stopped smoking for at least 15 years. Yearly screening should be stopped for people who develop a health problem that would prevent them from having lung cancer treatment.  If you are pregnant, do not drink alcohol. If you are  breastfeeding, be very cautious about drinking alcohol. If you are not pregnant and choose to drink alcohol, do not have more than 1 drink per day. One drink is considered to be 12 ounces (355 mL) of beer, 5 ounces (148 mL) of wine, or 1.5 ounces (44 mL) of liquor.  Avoid use of street drugs. Do not share needles with anyone. Ask for help if you need support or instructions about stopping the use of drugs.  High blood pressure causes heart disease and increases the risk  of stroke. Your blood pressure should be checked at least every 1 to 2 years. Ongoing high blood pressure should be treated with medicines if weight loss and exercise do not work.  If you are 55-79 years old, ask your health care provider if you should take aspirin to prevent strokes.  Diabetes screening is done by taking a blood sample to check your blood glucose level after you have not eaten for a certain period of time (fasting). If you are not overweight and you do not have risk factors for diabetes, you should be screened once every 3 years starting at age 45. If you are overweight or obese and you are 40-70 years of age, you should be screened for diabetes every year as part of your cardiovascular risk assessment.  Breast cancer screening is essential preventive care for women. You should practice "breast self-awareness." This means understanding the normal appearance and feel of your breasts and may include breast self-examination. Any changes detected, no matter how small, should be reported to a health care provider. Women in their 20s and 30s should have a clinical breast exam (CBE) by a health care provider as part of a regular health exam every 1 to 3 years. After age 40, women should have a CBE every year. Starting at age 40, women should consider having a mammogram (breast X-ray test) every year. Women who have a family history of breast cancer should talk to their health care provider about genetic screening. Women at a high risk of breast cancer should talk to their health care providers about having an MRI and a mammogram every year.  Breast cancer gene (BRCA)-related cancer risk assessment is recommended for women who have family members with BRCA-related cancers. BRCA-related cancers include breast, ovarian, tubal, and peritoneal cancers. Having family members with these cancers may be associated with an increased risk for harmful changes (mutations) in the breast cancer genes BRCA1 and  BRCA2. Results of the assessment will determine the need for genetic counseling and BRCA1 and BRCA2 testing.  Your health care provider may recommend that you be screened regularly for cancer of the pelvic organs (ovaries, uterus, and vagina). This screening involves a pelvic examination, including checking for microscopic changes to the surface of your cervix (Pap test). You may be encouraged to have this screening done every 3 years, beginning at age 21.  For women ages 30-65, health care providers may recommend pelvic exams and Pap testing every 3 years, or they may recommend the Pap and pelvic exam, combined with testing for human papilloma virus (HPV), every 5 years. Some types of HPV increase your risk of cervical cancer. Testing for HPV may also be done on women of any age with unclear Pap test results.  Other health care providers may not recommend any screening for nonpregnant women who are considered low risk for pelvic cancer and who do not have symptoms. Ask your health care provider if a screening pelvic exam is right for   you.  If you have had past treatment for cervical cancer or a condition that could lead to cancer, you need Pap tests and screening for cancer for at least 20 years after your treatment. If Pap tests have been discontinued, your risk factors (such as having a new sexual partner) need to be reassessed to determine if screening should resume. Some women have medical problems that increase the chance of getting cervical cancer. In these cases, your health care provider may recommend more frequent screening and Pap tests.  Colorectal cancer can be detected and often prevented. Most routine colorectal cancer screening begins at the age of 50 years and continues through age 75 years. However, your health care provider may recommend screening at an earlier age if you have risk factors for colon cancer. On a yearly basis, your health care provider may provide home test kits to check  for hidden blood in the stool. Use of a small camera at the end of a tube, to directly examine the colon (sigmoidoscopy or colonoscopy), can detect the earliest forms of colorectal cancer. Talk to your health care provider about this at age 50, when routine screening begins. Direct exam of the colon should be repeated every 5-10 years through age 75 years, unless early forms of precancerous polyps or small growths are found.  People who are at an increased risk for hepatitis B should be screened for this virus. You are considered at high risk for hepatitis B if:  You were born in a country where hepatitis B occurs often. Talk with your health care provider about which countries are considered high risk.  Your parents were born in a high-risk country and you have not received a shot to protect against hepatitis B (hepatitis B vaccine).  You have HIV or AIDS.  You use needles to inject street drugs.  You live with, or have sex with, someone who has hepatitis B.  You get hemodialysis treatment.  You take certain medicines for conditions like cancer, organ transplantation, and autoimmune conditions.  Hepatitis C blood testing is recommended for all people born from 1945 through 1965 and any individual with known risks for hepatitis C.  Practice safe sex. Use condoms and avoid high-risk sexual practices to reduce the spread of sexually transmitted infections (STIs). STIs include gonorrhea, chlamydia, syphilis, trichomonas, herpes, HPV, and human immunodeficiency virus (HIV). Herpes, HIV, and HPV are viral illnesses that have no cure. They can result in disability, cancer, and death.  You should be screened for sexually transmitted illnesses (STIs) including gonorrhea and chlamydia if:  You are sexually active and are younger than 24 years.  You are older than 24 years and your health care provider tells you that you are at risk for this type of infection.  Your sexual activity has changed  since you were last screened and you are at an increased risk for chlamydia or gonorrhea. Ask your health care provider if you are at risk.  If you are at risk of being infected with HIV, it is recommended that you take a prescription medicine daily to prevent HIV infection. This is called preexposure prophylaxis (PrEP). You are considered at risk if:  You are sexually active and do not regularly use condoms or know the HIV status of your partner(s).  You take drugs by injection.  You are sexually active with a partner who has HIV.  Talk with your health care provider about whether you are at high risk of being infected with HIV. If   you choose to begin PrEP, you should first be tested for HIV. You should then be tested every 3 months for as long as you are taking PrEP.  Osteoporosis is a disease in which the bones lose minerals and strength with aging. This can result in serious bone fractures or breaks. The risk of osteoporosis can be identified using a bone density scan. Women ages 67 years and over and women at risk for fractures or osteoporosis should discuss screening with their health care providers. Ask your health care provider whether you should take a calcium supplement or vitamin D to reduce the rate of osteoporosis.  Menopause can be associated with physical symptoms and risks. Hormone replacement therapy is available to decrease symptoms and risks. You should talk to your health care provider about whether hormone replacement therapy is right for you.  Use sunscreen. Apply sunscreen liberally and repeatedly throughout the day. You should seek shade when your shadow is shorter than you. Protect yourself by wearing long sleeves, pants, a wide-brimmed hat, and sunglasses year round, whenever you are outdoors.  Once a month, do a whole body skin exam, using a mirror to look at the skin on your back. Tell your health care provider of new moles, moles that have irregular borders, moles that  are larger than a pencil eraser, or moles that have changed in shape or color.  Stay current with required vaccines (immunizations).  Influenza vaccine. All adults should be immunized every year.  Tetanus, diphtheria, and acellular pertussis (Td, Tdap) vaccine. Pregnant women should receive 1 dose of Tdap vaccine during each pregnancy. The dose should be obtained regardless of the length of time since the last dose. Immunization is preferred during the 27th-36th week of gestation. An adult who has not previously received Tdap or who does not know her vaccine status should receive 1 dose of Tdap. This initial dose should be followed by tetanus and diphtheria toxoids (Td) booster doses every 10 years. Adults with an unknown or incomplete history of completing a 3-dose immunization series with Td-containing vaccines should begin or complete a primary immunization series including a Tdap dose. Adults should receive a Td booster every 10 years.  Varicella vaccine. An adult without evidence of immunity to varicella should receive 2 doses or a second dose if she has previously received 1 dose. Pregnant females who do not have evidence of immunity should receive the first dose after pregnancy. This first dose should be obtained before leaving the health care facility. The second dose should be obtained 4-8 weeks after the first dose.  Human papillomavirus (HPV) vaccine. Females aged 13-26 years who have not received the vaccine previously should obtain the 3-dose series. The vaccine is not recommended for use in pregnant females. However, pregnancy testing is not needed before receiving a dose. If a female is found to be pregnant after receiving a dose, no treatment is needed. In that case, the remaining doses should be delayed until after the pregnancy. Immunization is recommended for any person with an immunocompromised condition through the age of 61 years if she did not get any or all doses earlier. During the  3-dose series, the second dose should be obtained 4-8 weeks after the first dose. The third dose should be obtained 24 weeks after the first dose and 16 weeks after the second dose.  Zoster vaccine. One dose is recommended for adults aged 30 years or older unless certain conditions are present.  Measles, mumps, and rubella (MMR) vaccine. Adults born  before 1957 generally are considered immune to measles and mumps. Adults born in 1957 or later should have 1 or more doses of MMR vaccine unless there is a contraindication to the vaccine or there is laboratory evidence of immunity to each of the three diseases. A routine second dose of MMR vaccine should be obtained at least 28 days after the first dose for students attending postsecondary schools, health care workers, or international travelers. People who received inactivated measles vaccine or an unknown type of measles vaccine during 1963-1967 should receive 2 doses of MMR vaccine. People who received inactivated mumps vaccine or an unknown type of mumps vaccine before 1979 and are at high risk for mumps infection should consider immunization with 2 doses of MMR vaccine. For females of childbearing age, rubella immunity should be determined. If there is no evidence of immunity, females who are not pregnant should be vaccinated. If there is no evidence of immunity, females who are pregnant should delay immunization until after pregnancy. Unvaccinated health care workers born before 1957 who lack laboratory evidence of measles, mumps, or rubella immunity or laboratory confirmation of disease should consider measles and mumps immunization with 2 doses of MMR vaccine or rubella immunization with 1 dose of MMR vaccine.  Pneumococcal 13-valent conjugate (PCV13) vaccine. When indicated, a person who is uncertain of his immunization history and has no record of immunization should receive the PCV13 vaccine. All adults 65 years of age and older should receive this  vaccine. An adult aged 19 years or older who has certain medical conditions and has not been previously immunized should receive 1 dose of PCV13 vaccine. This PCV13 should be followed with a dose of pneumococcal polysaccharide (PPSV23) vaccine. Adults who are at high risk for pneumococcal disease should obtain the PPSV23 vaccine at least 8 weeks after the dose of PCV13 vaccine. Adults older than 29 years of age who have normal immune system function should obtain the PPSV23 vaccine dose at least 1 year after the dose of PCV13 vaccine.  Pneumococcal polysaccharide (PPSV23) vaccine. When PCV13 is also indicated, PCV13 should be obtained first. All adults aged 65 years and older should be immunized. An adult younger than age 65 years who has certain medical conditions should be immunized. Any person who resides in a nursing home or long-term care facility should be immunized. An adult smoker should be immunized. People with an immunocompromised condition and certain other conditions should receive both PCV13 and PPSV23 vaccines. People with human immunodeficiency virus (HIV) infection should be immunized as soon as possible after diagnosis. Immunization during chemotherapy or radiation therapy should be avoided. Routine use of PPSV23 vaccine is not recommended for American Indians, [place] Natives, or people younger than 65 years unless there are medical conditions that require PPSV23 vaccine. When indicated, people who have unknown immunization and have no record of immunization should receive PPSV23 vaccine. One-time revaccination 5 years after the first dose of PPSV23 is recommended for people aged 19-64 years who have chronic kidney failure, nephrotic syndrome, asplenia, or immunocompromised conditions. People who received 1-2 doses of PPSV23 before age 65 years should receive another dose of PPSV23 vaccine at age 65 years or later if at least 5 years have passed since the previous dose. Doses of PPSV23 are not  needed for people immunized with PPSV23 at or after age 65 years.  Meningococcal vaccine. Adults with asplenia or persistent complement component deficiencies should receive 2 doses of quadrivalent meningococcal conjugate (MenACWY-D) vaccine. The doses should be obtained   at least 2 months apart. Microbiologists working with certain meningococcal bacteria, [place] recruits, people at risk during an outbreak, and people who travel to or live in countries with a high rate of meningitis should be immunized. A first-year college student up through age 34 years who is living in a residence hall should receive a dose if she did not receive a dose on or after her 16th birthday. Adults who have certain high-risk conditions should receive one or more doses of vaccine.  Hepatitis A vaccine. Adults who wish to be protected from this disease, have certain high-risk conditions, work with hepatitis A-infected animals, work in hepatitis A research labs, or travel to or work in countries with a high rate of hepatitis A should be immunized. Adults who were previously unvaccinated and who anticipate close contact with an international adoptee during the first 60 days after arrival in the [place] from a country with a high rate of hepatitis A should be immunized.  Hepatitis B vaccine. Adults who wish to be protected from this disease, have certain high-risk conditions, may be exposed to blood or other infectious body fluids, are household contacts or sex partners of hepatitis B positive people, are clients or workers in certain care facilities, or travel to or work in countries with a high rate of hepatitis B should be immunized.  Haemophilus influenzae type b (Hib) vaccine. A previously unvaccinated person with asplenia or sickle cell disease or having a scheduled splenectomy should receive 1 dose of Hib vaccine. Regardless of previous immunization, a recipient of a hematopoietic stem cell transplant should receive a  3-dose series 6-12 months after her successful transplant. Hib vaccine is not recommended for adults with HIV infection. Preventive Services / Frequency Ages 35 to 4 years  Blood pressure check.** / Every 3-5 years.  Lipid and cholesterol check.** / Every 5 years beginning at age 60.  Clinical breast exam.** / Every 3 years for women in their 71s and 10s.  BRCA-related cancer risk assessment.** / For women who have family members with a BRCA-related cancer (breast, ovarian, tubal, or peritoneal cancers).  Pap test.** / Every 2 years from ages 76 through 26. Every 3 years starting at age 61 through age 76 or 93 with a history of 3 consecutive normal Pap tests.  HPV screening.** / Every 3 years from ages 37 through ages 60 to 51 with a history of 3 consecutive normal Pap tests.  Hepatitis C blood test.** / For any individual with known risks for hepatitis C.  Skin self-exam. / Monthly.  Influenza vaccine. / Every year.  Tetanus, diphtheria, and acellular pertussis (Tdap, Td) vaccine.** / Consult your health care provider. Pregnant women should receive 1 dose of Tdap vaccine during each pregnancy. 1 dose of Td every 10 years.  Varicella vaccine.** / Consult your health care provider. Pregnant females who do not have evidence of immunity should receive the first dose after pregnancy.  HPV vaccine. / 3 doses over 6 months, if 93 and younger. The vaccine is not recommended for use in pregnant females. However, pregnancy testing is not needed before receiving a dose.  Measles, mumps, rubella (MMR) vaccine.** / You need at least 1 dose of MMR if you were born in 1957 or later. You may also need a 2nd dose. For females of childbearing age, rubella immunity should be determined. If there is no evidence of immunity, females who are not pregnant should be vaccinated. If there is no evidence of immunity, females who are  pregnant should delay immunization until after pregnancy.  Pneumococcal  13-valent conjugate (PCV13) vaccine.** / Consult your health care provider.  Pneumococcal polysaccharide (PPSV23) vaccine.** / 1 to 2 doses if you smoke cigarettes or if you have certain conditions.  Meningococcal vaccine.** / 1 dose if you are age 68 to 8 years and a Market researcher living in a residence hall, or have one of several medical conditions, you need to get vaccinated against meningococcal disease. You may also need additional booster doses.  Hepatitis A vaccine.** / Consult your health care provider.  Hepatitis B vaccine.** / Consult your health care provider.  Haemophilus influenzae type b (Hib) vaccine.** / Consult your health care provider. Ages 7 to 53 years  Blood pressure check.** / Every year.  Lipid and cholesterol check.** / Every 5 years beginning at age 25 years.  Lung cancer screening. / Every year if you are aged 11-80 years and have a 30-pack-year history of smoking and currently smoke or have quit within the past 15 years. Yearly screening is stopped once you have quit smoking for at least 15 years or develop a health problem that would prevent you from having lung cancer treatment.  Clinical breast exam.** / Every year after age 48 years.  BRCA-related cancer risk assessment.** / For women who have family members with a BRCA-related cancer (breast, ovarian, tubal, or peritoneal cancers).  Mammogram.** / Every year beginning at age 41 years and continuing for as long as you are in good health. Consult with your health care provider.  Pap test.** / Every 3 years starting at age 65 years through age 37 or 70 years with a history of 3 consecutive normal Pap tests.  HPV screening.** / Every 3 years from ages 72 years through ages 60 to 40 years with a history of 3 consecutive normal Pap tests.  Fecal occult blood test (FOBT) of stool. / Every year beginning at age 21 years and continuing until age 5 years. You may not need to do this test if you get  a colonoscopy every 10 years.  Flexible sigmoidoscopy or colonoscopy.** / Every 5 years for a flexible sigmoidoscopy or every 10 years for a colonoscopy beginning at age 35 years and continuing until age 48 years.  Hepatitis C blood test.** / For all people born from [birth year] through 1965 and any individual with known risks for hepatitis C.  Skin self-exam. / Monthly.  Influenza vaccine. / Every year.  Tetanus, diphtheria, and acellular pertussis (Tdap/Td) vaccine.** / Consult your health care provider. Pregnant women should receive 1 dose of Tdap vaccine during each pregnancy. 1 dose of Td every 10 years.  Varicella vaccine.** / Consult your health care provider. Pregnant females who do not have evidence of immunity should receive the first dose after pregnancy.  Zoster vaccine.** / 1 dose for adults aged 30 years or older.  Measles, mumps, rubella (MMR) vaccine.** / You need at least 1 dose of MMR if you were born in 1957 or later. You may also need a second dose. For females of childbearing age, rubella immunity should be determined. If there is no evidence of immunity, females who are not pregnant should be vaccinated. If there is no evidence of immunity, females who are pregnant should delay immunization until after pregnancy.  Pneumococcal 13-valent conjugate (PCV13) vaccine.** / Consult your health care provider.  Pneumococcal polysaccharide (PPSV23) vaccine.** / 1 to 2 doses if you smoke cigarettes or if you have certain conditions.  Meningococcal vaccine.** /  Consult your health care provider.  Hepatitis A vaccine.** / Consult your health care provider.  Hepatitis B vaccine.** / Consult your health care provider.  Haemophilus influenzae type b (Hib) vaccine.** / Consult your health care provider. Ages 64 years and over  Blood pressure check.** / Every year.  Lipid and cholesterol check.** / Every 5 years beginning at age 23 years.  Lung cancer screening. / Every year if you  are aged 16-80 years and have a 30-pack-year history of smoking and currently smoke or have quit within the past 15 years. Yearly screening is stopped once you have quit smoking for at least 15 years or develop a health problem that would prevent you from having lung cancer treatment.  Clinical breast exam.** / Every year after age 74 years.  BRCA-related cancer risk assessment.** / For women who have family members with a BRCA-related cancer (breast, ovarian, tubal, or peritoneal cancers).  Mammogram.** / Every year beginning at age 44 years and continuing for as long as you are in good health. Consult with your health care provider.  Pap test.** / Every 3 years starting at age 58 years through age 22 or 39 years with 3 consecutive normal Pap tests. Testing can be stopped between 65 and 70 years with 3 consecutive normal Pap tests and no abnormal Pap or HPV tests in the past 10 years.  HPV screening.** / Every 3 years from ages 64 years through ages 70 or 61 years with a history of 3 consecutive normal Pap tests. Testing can be stopped between 65 and 70 years with 3 consecutive normal Pap tests and no abnormal Pap or HPV tests in the past 10 years.  Fecal occult blood test (FOBT) of stool. / Every year beginning at age 40 years and continuing until age 27 years. You may not need to do this test if you get a colonoscopy every 10 years.  Flexible sigmoidoscopy or colonoscopy.** / Every 5 years for a flexible sigmoidoscopy or every 10 years for a colonoscopy beginning at age 7 years and continuing until age 32 years.  Hepatitis C blood test.** / For all people born from [birth year] through 1964 and any individual with known risks for hepatitis C.  Osteoporosis screening.** / A one-time screening for women ages 30 years and over and women at risk for fractures or osteoporosis.  Skin self-exam. / Monthly.  Influenza vaccine. / Every year.  Tetanus, diphtheria, and acellular pertussis (Tdap/Td)  vaccine.** / 1 dose of Td every 10 years.  Varicella vaccine.** / Consult your health care provider.  Zoster vaccine.** / 1 dose for adults aged 35 years or older.  Pneumococcal 13-valent conjugate (PCV13) vaccine.** / Consult your health care provider.  Pneumococcal polysaccharide (PPSV23) vaccine.** / 1 dose for all adults aged 46 years and older.  Meningococcal vaccine.** / Consult your health care provider.  Hepatitis A vaccine.** / Consult your health care provider.  Hepatitis B vaccine.** / Consult your health care provider.  Haemophilus influenzae type b (Hib) vaccine.** / Consult your health care provider. ** Family history and personal history of risk and conditions may change your health care provider's recommendations.   This information is not intended to replace advice given to you by your health care provider. Make sure you discuss any questions you have with your health care provider.   Document Released: 10/07/2001 Document Revised: 09/01/2014 Document Reviewed: 01/06/2011 Elsevier Interactive Patient Education [employer].

## 2016-04-10 NOTE — Progress Notes (Signed)
GYNECOLOGY CLINIC ANNUAL PREVENTATIVE CARE ENCOUNTER NOTE  Subjective:   Bailey Mendoza is a 30 y.o. G0P0000 female here for a routine annual gynecologic exam.  Current complaints: none.  Desires STI screen.   Denies abnormal vaginal bleeding, discharge, pelvic pain, problems with intercourse or other gynecologic concerns.    Gynecologic Historynn Patient's last menstrual period was 04/02/2016. Contraception: OCP (estrogen/progesterone) Last Pap: 05/18/14. Results were: normal  Obstetric History OB History  Gravida Para Term Preterm AB Living  0 0 0 0 0 0  SAB TAB Ectopic Multiple Live Births  0 0 0 0          History reviewed. No pertinent past medical history.  History reviewed. No pertinent surgical history.  Current Outpatient Prescriptions on File Prior to Visit  Medication Sig Dispense Refill  . SPRINTEC 28 0.25-35 MG-MCG tablet TAKE 1 TABLET DAILY 84 tablet 4   No current facility-administered medications on file prior to visit.     No Known Allergies  Social History   Social History  . Marital status: Single    Spouse name: N/A  . Number of children: N/A  . Years of education: N/A   Occupational History  . Not on file.   Social History Main Topics  . Smoking status: Never Smoker  . Smokeless tobacco: Never Used  . Alcohol use Yes     Comment: social  . Drug use: No  . Sexual activity: Yes    Partners: Male    Birth control/ protection: Pill   Other Topics Concern  . Not on file   Social History Narrative  . No narrative on file    Family History  Problem Relation Age of Onset  . Hypertension Mother   . Diabetes Maternal Grandfather   . Diabetes Paternal Grandfather     The following portions of the patient's history were reviewed and updated as appropriate: allergies, current medications, past family history, past medical history, past social history, past surgical history and problem list.  Review of Systems Pertinent items noted  in HPI and remainder of comprehensive ROS otherwise negative.   Objective:  BP 121/81   Pulse 79   Ht 5\' 3"  (1.6 m)   Wt 187 lb (84.8 kg)   LMP 04/02/2016   BMI 33.13 kg/m  CONSTITUTIONAL: Well-developed, well-nourished female in no acute distress.  HENT:  Normocephalic, atraumatic, External right and left ear normal. Oropharynx is clear and moist EYES: Conjunctivae and EOM are normal. Pupils are equal, round, and reactive to light. No scleral icterus.  NECK: Normal range of motion, supple, no masses.  Normal thyroid.  SKIN: Skin is warm and dry. No rash noted. Not diaphoretic. No erythema. No pallor. NEUROLOGIC: Alert and oriented to person, place, and time. Normal reflexes, muscle tone coordination. No cranial nerve deficit noted. PSYCHIATRIC: Normal mood and affect. Normal behavior. Normal judgment and thought content. CARDIOVASCULAR: Normal heart rate noted, regular rhythm RESPIRATORY: Clear to auscultation bilaterally. Effort and breath sounds normal, no problems with respiration noted. BREASTS: Symmetric in size. No masses, skin changes, nipple drainage, or lymphadenopathy. ABDOMEN: Soft, normal bowel sounds, no distention noted.  No tenderness, rebound or guarding.  PELVIC: Normal appearing external genitalia; normal appearing vaginal mucosa and cervix.  No abnormal discharge noted.  Pap smear obtained.  Normal uterine size, no other palpable masses, no uterine or adnexal tenderness. MUSCULOSKELETAL: Normal range of motion. No tenderness.  No cyanosis, clubbing, or edema.  2+ distal pulses.   Assessment:  Annual gynecologic examination with pap smear Patient desires STI screen   Plan:  Will follow up results of pap smear and STI screen and manage accordingly. Routine preventative health maintenance measures emphasized. Please refer to After Visit Summary for other counseling recommendations.    Verita Schneiders, MD, [place] Attending Obstetrician & Gynecologist, [place] for [facility]

## 2016-04-11 LAB — HEPATITIS C ANTIBODY: HCV Ab: NEGATIVE

## 2016-04-11 LAB — HIV ANTIBODY (ROUTINE TESTING W REFLEX): HIV: NONREACTIVE

## 2016-04-11 LAB — RPR

## 2016-04-11 LAB — HEPATITIS B SURFACE ANTIGEN: Hepatitis B Surface Ag: NEGATIVE

## 2016-04-14 ENCOUNTER — Encounter: Payer: Self-pay | Admitting: *Deleted

## 2016-04-14 LAB — CYTOLOGY - PAP

## 2017-03-09 ENCOUNTER — Telehealth: Payer: Self-pay | Admitting: *Deleted

## 2017-03-09 MED ORDER — NORGESTIMATE-ETH ESTRADIOL 0.25-35 MG-MCG PO TABS
1.0000 | ORAL_TABLET | Freq: Every day | ORAL | 3 refills | Status: DC
Start: 2017-03-09 — End: 2017-06-02

## 2017-03-09 NOTE — Telephone Encounter (Signed)
Pt called stating she left her birth control in a hotel room while she was out of town.  Asking if we have samples, informed pt that we did not have any samples that were the same type as the birth control she was currently using but we could send a rx to [facility] and she could get it on their 9 dollar rx list.  Pt requested rx to be sent to [facility].

## 2017-03-15 ENCOUNTER — Other Ambulatory Visit: Payer: Self-pay | Admitting: Obstetrics and Gynecology

## 2017-03-16 ENCOUNTER — Telehealth: Payer: Self-pay

## 2017-03-16 NOTE — Telephone Encounter (Signed)
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

## 2017-06-02 ENCOUNTER — Other Ambulatory Visit: Payer: Self-pay

## 2017-06-02 MED ORDER — NORGESTIMATE-ETH ESTRADIOL 0.25-35 MG-MCG PO TABS: 1.0000 | ORAL_TABLET | Freq: Every day | ORAL | 3 refills | Status: DC

## 2017-06-02 NOTE — Telephone Encounter (Signed)
Received refill request for patient birth control pills. [facility]

## 2017-08-25 HISTORY — PX: BREAST BIOPSY: SHX20

## 2017-12-01 ENCOUNTER — Emergency Department (HOSPITAL_COMMUNITY)
Admission: EM | Admit: 2017-12-01 | Discharge: 2017-12-01 | Disposition: A | Discharge status: Home / Self Care | Payer: 59 | Attending: Emergency Medicine | Admitting: Emergency Medicine

## 2017-12-01 ENCOUNTER — Encounter (HOSPITAL_COMMUNITY): Payer: Self-pay | Admitting: *Deleted

## 2017-12-01 DIAGNOSIS — N611 Abscess of the breast and nipple: Secondary | ICD-10-CM | POA: Diagnosis not present

## 2017-12-01 DIAGNOSIS — Z79899 Other long term (current) drug therapy: Secondary | ICD-10-CM | POA: Diagnosis not present

## 2017-12-01 DIAGNOSIS — N644 Mastodynia: Secondary | ICD-10-CM | POA: Diagnosis present

## 2017-12-01 MED ORDER — HYDROCODONE-ACETAMINOPHEN 5-325 MG PO TABS: 1.0000 | ORAL_TABLET | Freq: Four times a day (QID) | ORAL | 0 refills | Status: DC | PRN

## 2017-12-01 NOTE — ED Provider Notes (Signed)
[facility] DEPT Provider Note   CSN: [id number] Arrival date & time: 12/01/17  1640     History   Chief Complaint Chief Complaint  Patient presents with  . Abscess    HPI Bailey Mendoza is a 32 y.o. female who presents today for evaluation of pain in her left breast.  She reports that she has been treated for a right breast abscess for the past 3 days with doxycycline.  She reports compliance with her medications.  She reports that she feels like it is continuing to get worse despite taking the doxycycline.  She denies any fevers or chills, no nausea vomiting or diarrhea.  She denies the possibility of pregnancy, states that she is not postpartum and is not breast-feeding.  She reports that she had a similar event about 4 years ago but that was much less severe and resolved rapidly with p.o. Antibiotics.  Reports that she is up-to-date on all vaccines.  She reports that she does not have a primary care doctor and was diagnosed at the urgent care. HPI  History reviewed. No pertinent past medical history.  There are no active problems to display for this patient.   History reviewed. No pertinent surgical history.   OB History    Gravida  0   Para  0   Term  0   Preterm  0   AB  0   Living  0     SAB  0   TAB  0   Ectopic  0   Multiple  0   Live Births               Home Medications    Prior to Admission medications   Medication Sig Start Date End Date Taking? Authorizing Provider  ESTARYLLA 0.25-35 MG-MCG tablet TAKE 1 TABLET DAILY Patient not taking: Reported on 06/02/2017 03/16/17   Constant, Peggy, MD  HYDROcodone-acetaminophen (NORCO/VICODIN) 5-325 MG tablet Take 1 tablet by mouth every 6 (six) hours as needed. 12/01/17   Lorin Glass, PA-C  norgestimate-ethinyl estradiol (ORTHO-CYCLEN,SPRINTEC,PREVIFEM) 0.25-35 MG-MCG tablet Take 1 tablet by mouth daily. 06/02/17   Osborne Oman, MD  [place] 28 0.25-35  MG-MCG tablet TAKE 1 TABLET DAILY Patient not taking: Reported on 06/02/2017 01/22/16   Constant, Peggy, MD    Family History Family History  Problem Relation Age of Onset  . Hypertension Mother   . Diabetes Maternal Grandfather   . Diabetes Paternal Grandfather     Social History Social History   Tobacco Use  . Smoking status: Never Smoker  . Smokeless tobacco: Never Used  Substance Use Topics  . Alcohol use: Yes    Comment: social  . Drug use: No     Allergies   Patient has no known allergies.   Review of Systems Review of Systems  Constitutional: Negative for chills and fever.  Gastrointestinal: Negative for abdominal pain, nausea and vomiting.  Skin:       Redness, pain over inferior medial aspect of right breast.  Allergic/Immunologic: Negative for immunocompromised state.  Neurological: Negative for dizziness, weakness, numbness and headaches.  All other systems reviewed and are negative.    Physical Exam Updated Vital Signs BP 121/87 (BP Location: Right Arm)   Pulse 85   Temp 98.1 F (36.7 C) (Oral)   Resp 18   Ht 5\' 3"  (1.6 m)   Wt 86.2 kg (190 lb)   LMP 11/19/2017   SpO2 100%   BMI 33.66  kg/m   Physical Exam  Constitutional: She appears well-developed and well-nourished. No distress.  HENT:  Head: Normocephalic and atraumatic.  Eyes: Conjunctivae are normal. Right eye exhibits no discharge. Left eye exhibits no discharge. No scleral icterus.  Neck: Normal range of motion.  Cardiovascular: Normal rate and regular rhythm.  Pulmonary/Chest: Effort normal. No stridor. No respiratory distress. Right breast exhibits skin change and tenderness. Right breast exhibits no inverted nipple and no nipple discharge. Left breast exhibits no inverted nipple, no nipple discharge, no skin change and no tenderness.  On the inferior medial aspect of the right breast there is obvious redness with induration.  There is a clear area of fluctuance superficially.  There  are no obvious wounds or skin breaks on the breast.  There is no obvious discharge. Breast exam and palpation performed with chaperone in room. The redness continues across her breast laterally on the lateral side of her areola.    Abdominal: She exhibits no distension.  Musculoskeletal: She exhibits no edema or deformity.  Neurological: She is alert. She exhibits normal muscle tone.  Skin: Skin is warm and dry. She is not diaphoretic.  Psychiatric: She has a normal mood and affect. Her behavior is normal.  Nursing note and vitals reviewed.    ED Treatments / Results  Labs (all labs ordered are listed, but only abnormal results are displayed) Labs Reviewed  AEROBIC/ANAEROBIC CULTURE (SURGICAL/DEEP WOUND)    EKG None  Radiology No results found.  Procedures .Marland KitchenIncision and Drainage Date/Time: 12/01/2017 10:40 PM Performed by: Lorin Glass, PA-C Authorized by: Lorin Glass, PA-C   Consent:    Consent obtained:  Verbal   Consent given by:  Patient   Risks discussed:  Bleeding, incomplete drainage, pain and infection (Damage to other structures, need for additional procedures)   Alternatives discussed:  No treatment, alternative treatment and referral Location:    Type:  Abscess   Location: Right breast. Pre-procedure details:    Skin preparation:  Chloraprep Anesthesia (see MAR for exact dosages):    Anesthesia method:  None Procedure type:    Complexity:  Simple Procedure details:    Needle aspiration: yes     Needle size:  18 G   Drainage:  Purulent   Drainage amount:  Moderate Post-procedure details:    Patient tolerance of procedure:  Tolerated well, no immediate complications Comments:     Aspiration of abscess was performed.  Needle was inserted at the inferior medial aspect of the right breast (about 3cm off the chest wall) with the needle directed out away from the chest wall.  Prior to procedure discussed with patient the anesthetic options and  she elected to proceed without local anesthetic.   (including critical care time) EMERGENCY DEPARTMENT US SOFT TISSUE INTERPRETATION "Study: Limited Soft Tissue Ultrasound"  INDICATIONS: Pain and Soft tissue infection Multiple views of the body part were obtained in real-time with a multi-frequency linear probe  PERFORMED BY: Myself IMAGES ARCHIVED?: Yes SIDE:Right  BODY PART:Breast INTERPRETATION:  Abcess present and Cellulitis present     Medications Ordered in ED Medications - No data to display   Initial Impression / Assessment and Plan / ED Course  I have reviewed the triage vital signs and the nursing notes.  Pertinent labs & imaging results that were available during my care of the patient were reviewed by me and considered in my medical decision making (see chart for details).    Patient presents today for evaluation of a breast abscess.  She has been being treated for this abscess for the past 3 days and reports that it has worsened.  She does not meet Sirs or sepsis criteria, is afebrile and not tachycardic.  Ultrasound was used showing a large abscess in the right breast over the area of pain.  We discussed treatment options for this including breast center follow-up, however patient states that she does not have a primary care provider or anyone who she could follow up with about this.  We discussed the possible options and she elected to proceed with needle aspiration to help relieve some of the pressure and pain.  Needle aspiration was successfully performed, during the procedure the needle was directed away from the chest wall to avoid causing pneumothorax.  The procedure lungs clear to auscultation bilaterally.  After the procedure patient stated that she does have a OB/GYN who she can follow-up with.  As she has a provider to follow-up with she is eligible for ultrasound guided drainage of the breast center.  Orders were placed for this and she is instructed to follow-up  with her OB/GYN.  Patient was instructed on over-the-counter pain management.  She was instructed to continue taking her antibiotics.  The aspirated purulent material was sent for culture.  Ultrasound did show that her abscess appeared to be loculated, and prior to procedure she was informed that she will need repeat procedure.  Patient given short course of Vicodin with appropriate precautions given.  Patient discharged home.   Final Clinical Impressions(s) / ED Diagnoses   Final diagnoses:  Abscess of right breast    ED Discharge Orders        Ordered    US BREAST ASPIRATION RIGHT     12/01/17 2103    HYDROcodone-acetaminophen (NORCO/VICODIN) 5-325 MG tablet  Every 6 hours PRN     12/01/17 2105       Lorin Glass, PA-C 12/01/17 2247    Isla Pence, MD 12/01/17 2250

## 2017-12-01 NOTE — ED Triage Notes (Signed)
Pt complains of abscess to right breast. Pt was seen at urgent care and placed on abx 3 days ago. Pt states the pain is getting worse and wanted to have it evaluated.

## 2017-12-01 NOTE — Discharge Instructions (Addendum)
I have asked orders for you to have a ultrasound-guided breast abscess aspiration by the breast center.  If you do not hear from them by noon tomorrow then please call them.  If you have any difficulties please call your OB/GYN's office.    Please take Ibuprofen (Advil, motrin) and Tylenol (acetaminophen) to relieve your pain.  You may take up to 600 MG (3 pills) of normal strength ibuprofen every 8 hours as needed.  In between doses of ibuprofen you make take tylenol, up to 1,000 mg (two extra strength pills).  Do not take more than 3,000 mg tylenol in a 24 hour period.  Please check all medication labels as many medications such as pain and cold medications may contain tylenol.  Do not drink alcohol while taking these medications.  Do not take other NSAID'S while taking ibuprofen (such as aleve or naproxen).  Please take ibuprofen with food to decrease stomach upset.  You are being prescribed a medication which may make you sleepy. For 24 hours after one dose please do not drive, operate heavy machinery, care for a small child with out another adult present, or perform any activities that may cause harm to you or someone else if you were to fall asleep or be impaired.

## 2017-12-02 ENCOUNTER — Ambulatory Visit (INDEPENDENT_AMBULATORY_CARE_PROVIDER_SITE_OTHER): Payer: 59 | Admitting: Nurse Practitioner

## 2017-12-02 ENCOUNTER — Encounter: Payer: Self-pay | Admitting: Nurse Practitioner

## 2017-12-02 ENCOUNTER — Other Ambulatory Visit: Payer: Self-pay | Admitting: Nurse Practitioner

## 2017-12-02 VITALS — BP 114/79 | HR 72 | Wt 191.0 lb

## 2017-12-02 DIAGNOSIS — Z113 Encounter for screening for infections with a predominantly sexual mode of transmission: Secondary | ICD-10-CM

## 2017-12-02 DIAGNOSIS — N611 Abscess of the breast and nipple: Secondary | ICD-10-CM

## 2017-12-02 DIAGNOSIS — Z01419 Encounter for gynecological examination (general) (routine) without abnormal findings: Secondary | ICD-10-CM | POA: Diagnosis not present

## 2017-12-02 DIAGNOSIS — E669 Obesity, unspecified: Secondary | ICD-10-CM

## 2017-12-02 NOTE — Patient Instructions (Addendum)
Drink 64 ounces of water daily. Get exercise at least 5 times a week. Return in one year for your Pap Smear. Be seen at the [facility] for evaluation of your right breast and to make sure the abscess and cellulitis heals fully.   Cellulitis, Adult Cellulitis is a skin infection. The infected area is usually red and sore. This condition occurs most often in the arms and lower legs. It is very important to get treated for this condition. Follow these instructions at home:  Take over-the-counter and prescription medicines only as told by your doctor.  If you were prescribed an antibiotic medicine, take it as told by your doctor. Do not stop taking the antibiotic even if you start to feel better.  Drink enough fluid to keep your pee (urine) clear or pale yellow.  Do not touch or rub the infected area.  Raise (elevate) the infected area above the level of your heart while you are sitting or lying down.  Place warm or cold wet cloths (warm or cold compresses) on the infected area. Do this as told by your doctor.  Keep all follow-up visits as told by your doctor. This is important. These visits let your doctor make sure your infection is not getting worse. Contact a doctor if:  You have a fever.  Your symptoms do not get better after 1-2 days of treatment.  Your bone or joint under the infected area starts to hurt after the skin has healed.  Your infection comes back. This can happen in the same area or another area.  You have a swollen bump in the infected area.  You have new symptoms.  You feel ill and also have muscle aches and pains. Get help right away if:  Your symptoms get worse.  You feel very sleepy.  You throw up (vomit) or have watery poop (diarrhea) for a long time.  There are red streaks coming from the infected area.  Your red area gets larger.  Your red area turns darker. This information is not intended to replace advice given to you by your health care  provider. Make sure you discuss any questions you have with your health care provider. Document Released: 01/28/2008 Document Revised: 01/17/2016 Document Reviewed: 06/20/2015 Elsevier Interactive Patient Education  2018 [employer].

## 2017-12-02 NOTE — Progress Notes (Signed)
GYNECOLOGY ANNUAL PREVENTATIVE CARE ENCOUNTER NOTE  Subjective:   Bailey Mendoza is a 32 y.o. G0P0000 female here for a routine annual gynecologic exam.  Current complaints: breast abscess.   Denies abnormal vaginal bleeding, discharge, pelvic pain, problems with intercourse or other gynecologic concerns.   Went to Urgent care on Saturday for breast abscess which spontaneously arose.  Started on Doxycycline, but it was not ready to be drained - area was still hard.  The pain and infection continued to worsen and she went to the ER yesterday and it had gotten very red and was causing pain into her side and back - area was drained at the ER.  [facility] but the [place] needed an order from the office where she is being followed (not the ER) for her to be seen.   Gynecologic History Patient's last menstrual period was 11/19/2017. Contraception: OCP (estrogen/progesterone) Last Pap: 2017. Results were: normal Last mammogram: Not indicated.  Obstetric History OB History  Gravida Para Term Preterm AB Living  0 0 0 0 0 0  SAB TAB Ectopic Multiple Live Births  0 0 0 0      No past medical history on file.  No past surgical history on file.  Current Outpatient Medications on File Prior to Visit  Medication Sig Dispense Refill  . doxycycline (VIBRA-TABS) 100 MG tablet TK 1 T PO BID FOR 10 DAYS  0  . norgestimate-ethinyl estradiol (ORTHO-CYCLEN,SPRINTEC,PREVIFEM) 0.25-35 MG-MCG tablet Take 1 tablet by mouth daily. 3 Package 3  . HYDROcodone-acetaminophen (NORCO/VICODIN) 5-325 MG tablet Take 1 tablet by mouth every 6 (six) hours as needed. (Patient not taking: Reported on 12/02/2017) 10 tablet 0   No current facility-administered medications on file prior to visit.     No Known Allergies  Social History   Socioeconomic History  . Marital status: Single    Spouse name: Not on file  . Number of children: Not on file  . Years of education: Not on file    . Highest education level: Not on file  Occupational History  . Not on file  Social Needs  . Financial resource strain: Not on file  . Food insecurity:    Worry: Not on file    Inability: Not on file  . Transportation needs:    Medical: Not on file    Non-medical: Not on file  Tobacco Use  . Smoking status: Never Smoker  . Smokeless tobacco: Never Used  Substance and Sexual Activity  . Alcohol use: Yes    Comment: social  . Drug use: No  . Sexual activity: Yes    Partners: Male    Birth control/protection: Pill  Lifestyle  . Physical activity:    Days per week: Not on file    Minutes per session: Not on file  . Stress: Not on file  Relationships  . Social connections:    Talks on phone: Not on file    Gets together: Not on file    Attends religious service: Not on file    Active member of club or organization: Not on file    Attends meetings of clubs or organizations: Not on file    Relationship status: Not on file  . Intimate partner violence:    Fear of current or ex partner: Not on file    Emotionally abused: Not on file    Physically abused: Not on file    Forced sexual activity: Not  on file  Other Topics Concern  . Not on file  Social History Narrative  . Not on file    Family History  Problem Relation Age of Onset  . Hypertension Mother   . Diabetes Maternal Grandfather   . Diabetes Paternal Grandfather     The following portions of the patient's history were reviewed and updated as appropriate: allergies, current medications, past family history, past medical history, past social history, past surgical history and problem list.  Review of Systems Pertinent items noted in HPI and remainder of comprehensive ROS otherwise negative.   Objective:  BP 114/79   Pulse 72   Wt 191 lb (86.6 kg)   LMP 11/19/2017   BMI 33.83 kg/m  CONSTITUTIONAL: Well-developed, well-nourished female in no acute distress.  HENT:  Normocephalic, atraumatic, External right  and left ear normal.  EYES: Conjunctivae and EOM are normal. No scleral icterus.  NECK: Normal range of motion, supple, no masses.  Normal thyroid.  SKIN: Skin is warm and dry. No rash noted. Not diaphoretic. No erythema. No pallor. NEUROLOGIC: Alert and oriented to person, place, and time. Normal muscle tone coordination. No cranial nerve deficit noted. PSYCHIATRIC: Normal mood and affect. Normal behavior. Normal judgment and thought content. CARDIOVASCULAR: Normal heart rate noted, regular rhythm RESPIRATORY: Clear to auscultation bilaterally. Effort and breath sounds normal, no problems with respiration noted. BREASTS: Symmetric in size. On left side - no masses, skin changes, nipple drainage, or lymphadenopathy.  On right side - exam is limited due to abscess being present.  Can see ink lines drawn in the ER where the redness extended beyond the nipple line on the right side.  So there has been significant decrease in the redness.  There is still at approx 6-7 cm diameter of redness and 4 x 6 cm of firm, tender, red tissue.  Periodic draining still occurring but none seen today. ABDOMEN: Soft, normal bowel sounds, no distention noted.  No tenderness, rebound or guarding.  PELVIC: Deferred - STD testin on urine - pap due in one year. MUSCULOSKELETAL: Normal range of motion. No tenderness.  No cyanosis, clubbing, or edema.    Assessment and Plan:  1. Well woman exam with routine gynecological exam Pap not done today - due in 2020 Reviewed good health advice for her. Advised weight loss with healthy eating and increased exercise.  Obesity for BMI of 33 added to problem list.  2. Breast abscess Referred to the [facility] for further evaluation and management of the improving but still significant right breast abscess.  Will follow up results of pap smear and manage accordingly. Mammogram scheduled Routine preventative health maintenance measures emphasized. Please refer to After Visit  Summary for other counseling recommendations.  Drink 64 ounces of water daily. Get exercise at least 5 times a week. Return in one year for your Pap Smear. Be seen at the [facility] for evaluation of your right breast and to make sure the abscess and cellulitis heals fully.   Earlie Server, RN, MSN, NP-BC Nurse Practitioner, [place] for [facility]

## 2017-12-02 NOTE — Progress Notes (Signed)
Last pap 04/10/2016 normal  Check abscess on right breast Std testing

## 2017-12-03 LAB — URINE CYTOLOGY ANCILLARY ONLY
CHLAMYDIA, DNA PROBE: NEGATIVE
Neisseria Gonorrhea: NEGATIVE
Trichomonas: NEGATIVE

## 2017-12-04 ENCOUNTER — Other Ambulatory Visit: Payer: Self-pay | Admitting: Nurse Practitioner

## 2017-12-04 ENCOUNTER — Ambulatory Visit
Admission: RE | Admit: 2017-12-04 | Discharge: 2017-12-04 | Disposition: A | Discharge status: Home / Self Care | Payer: 59 | Source: Ambulatory Visit | Attending: Nurse Practitioner | Admitting: Nurse Practitioner

## 2017-12-04 DIAGNOSIS — N611 Abscess of the breast and nipple: Secondary | ICD-10-CM

## 2017-12-07 ENCOUNTER — Telehealth: Payer: Self-pay | Admitting: *Deleted

## 2017-12-07 LAB — AEROBIC/ANAEROBIC CULTURE (SURGICAL/DEEP WOUND)

## 2017-12-07 LAB — AEROBIC/ANAEROBIC CULTURE W GRAM STAIN (SURGICAL/DEEP WOUND)

## 2017-12-07 MED ORDER — DOXYCYCLINE HYCLATE 100 MG PO TABS: 100.0000 mg | ORAL_TABLET | Freq: Two times a day (BID) | ORAL | 0 refills | Status: AC

## 2017-12-07 NOTE — Telephone Encounter (Signed)
Bailey Mendoza from [facility] called in stating pt needs abx for breast abscess that was diagnosed at last office visit.

## 2017-12-07 NOTE — Telephone Encounter (Signed)
I can't tell if she ever started the doxycycline. If she hasn't started the doxy, tell her to fill it and to complete the medication. thanks

## 2017-12-08 ENCOUNTER — Telehealth: Payer: Self-pay | Admitting: *Deleted

## 2017-12-08 NOTE — Telephone Encounter (Signed)
Post ED Visit - Positive Culture Follow-up  Culture report reviewed by antimicrobial stewardship pharmacist:  []  Elenor Quinones, Pharm.D. []  Heide Guile, Pharm.D., BCPS AQ-ID []  Parks Neptune, Pharm.D., BCPS []  Alycia Rossetti, Pharm.D., BCPS []  [place], Pharm.D., BCPS, AAHIVP [x]  Legrand Como, Pharm.D., BCPS, AAHIVP []  Salome Arnt, PharmD, BCPS []  Jalene Mullet, PharmD []  Vincenza Hews, PharmD, BCPS  Positive wound culture   Finish Doxycycline and follow up at [place] 12/08/2017, 10:05 AM

## 2017-12-18 ENCOUNTER — Other Ambulatory Visit: Payer: Self-pay | Admitting: Nurse Practitioner

## 2017-12-18 ENCOUNTER — Ambulatory Visit
Admission: RE | Admit: 2017-12-18 | Discharge: 2017-12-18 | Disposition: A | Discharge status: Home / Self Care | Payer: 59 | Source: Ambulatory Visit | Attending: Nurse Practitioner | Admitting: Nurse Practitioner

## 2017-12-18 DIAGNOSIS — N611 Abscess of the breast and nipple: Secondary | ICD-10-CM

## 2017-12-24 ENCOUNTER — Encounter: Payer: Self-pay | Admitting: Nurse Practitioner

## 2018-01-20 ENCOUNTER — Inpatient Hospital Stay: Admission: RE | Admit: 2018-01-20 | Payer: 59 | Source: Ambulatory Visit

## 2018-01-20 ENCOUNTER — Other Ambulatory Visit: Payer: 59

## 2018-01-25 ENCOUNTER — Telehealth: Payer: 59 | Admitting: Family

## 2018-01-25 DIAGNOSIS — B373 Candidiasis of vulva and vagina: Secondary | ICD-10-CM

## 2018-01-25 DIAGNOSIS — B3731 Acute candidiasis of vulva and vagina: Secondary | ICD-10-CM

## 2018-01-25 MED ORDER — FLUCONAZOLE 150 MG PO TABS: 150.0000 mg | ORAL_TABLET | Freq: Once | ORAL | 0 refills | Status: AC

## 2018-01-25 NOTE — Progress Notes (Signed)

## 2018-02-09 ENCOUNTER — Ambulatory Visit
Admission: RE | Admit: 2018-02-09 | Discharge: 2018-02-09 | Disposition: A | Discharge status: Home / Self Care | Payer: 59 | Source: Ambulatory Visit | Attending: Nurse Practitioner | Admitting: Nurse Practitioner

## 2018-02-09 DIAGNOSIS — N611 Abscess of the breast and nipple: Secondary | ICD-10-CM

## 2018-05-04 ENCOUNTER — Other Ambulatory Visit: Payer: Self-pay | Admitting: Obstetrics & Gynecology

## 2018-10-13 ENCOUNTER — Encounter: Payer: Self-pay | Admitting: Radiology

## 2018-11-24 IMAGING — US ULTRASOUND RIGHT BREAST LIMITED
1 series · 12 of 12 positions shown · non-contrast
Comparison: Previous exam(s).

CLINICAL DATA: Followup aspirated abscess and ultrasound-guided
core needle biopsy in the 4 o'clock position of the right breast
with pathology results of densely inflamed fibroadipose tissue
consistent with abscess. Follow-up 2 abnormally enlarged lymph nodes
in the upper-outer quadrant of the right breast, 1 biopsied under
ultrasound guidance and demonstrating suppurative lymphadenitis. The
biopsies and aspiration were performed on 12/04/2017. The patient is
markedly improved symptomatically and finished a 10 day course of
doxycycline earlier this week.

EXAM:
DIGITAL DIAGNOSTIC RIGHT MAMMOGRAM WITH CAD AND TOMO
ULTRASOUND RIGHT BREAST

[Series 1: ultrasound right breast limited · 0.07mm/px · 12 of 12 slices shown]
[im 1/12]
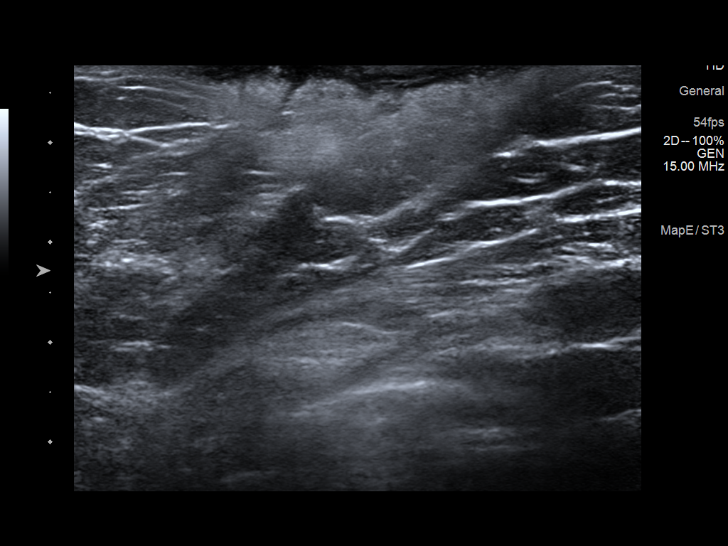
[im 2/12]
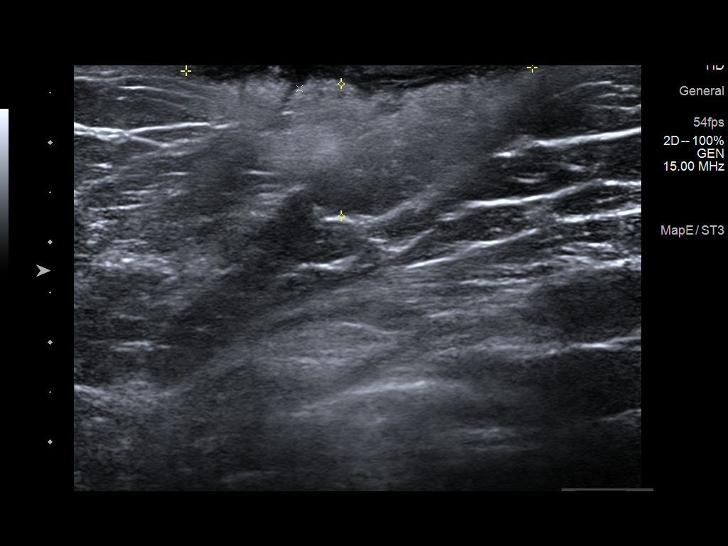
[im 3/12]
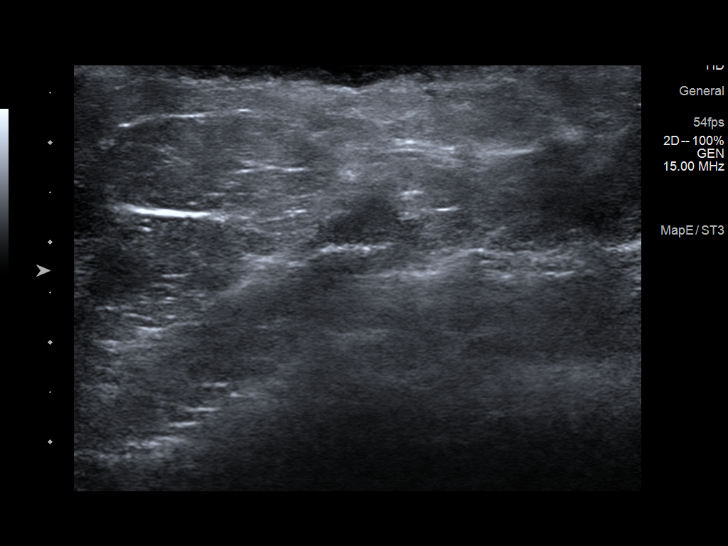
[im 4/12]
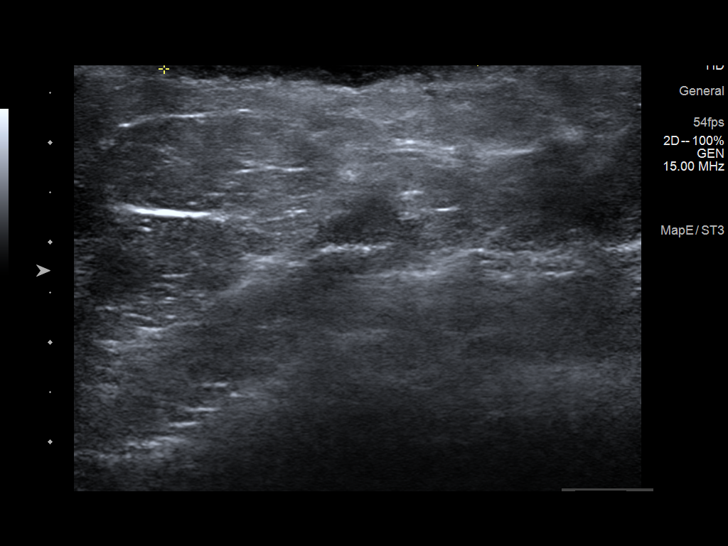
[im 5/12]
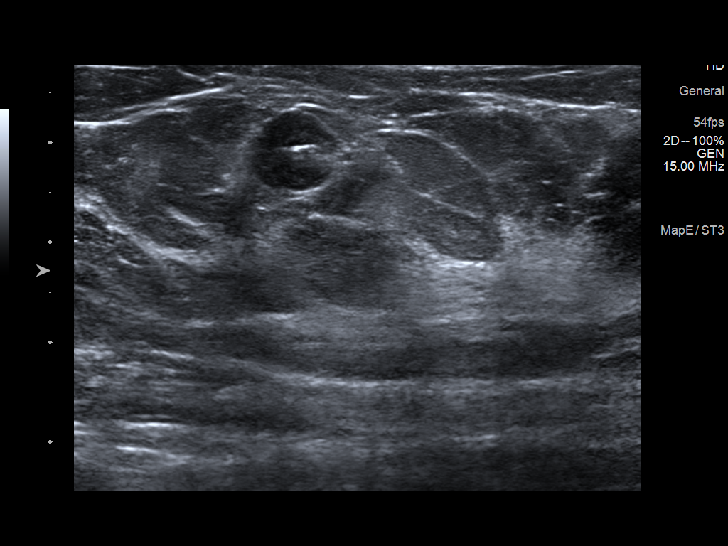
[im 6/12]
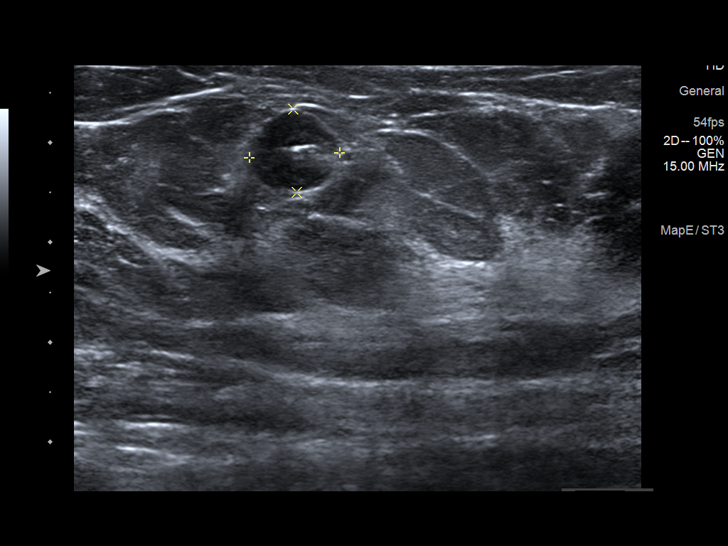
[im 7/12]
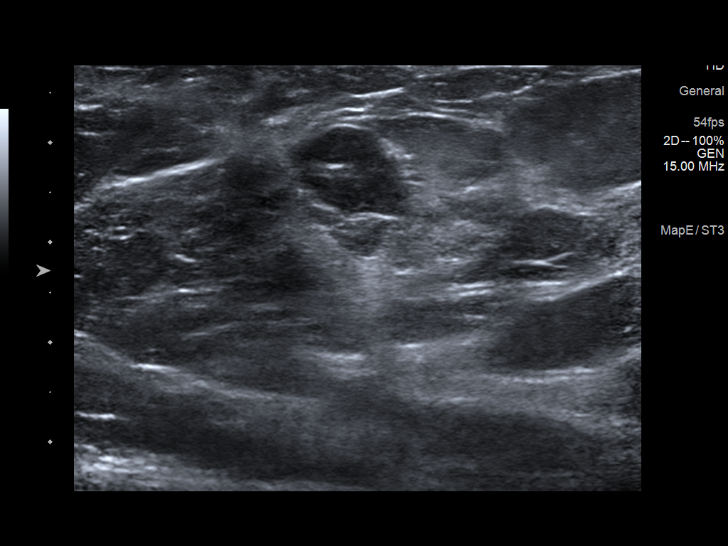
[im 8/12]
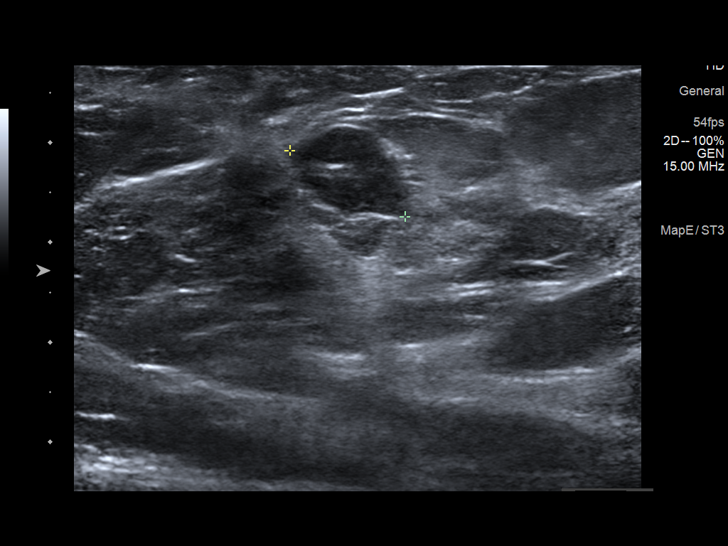
[im 9/12]
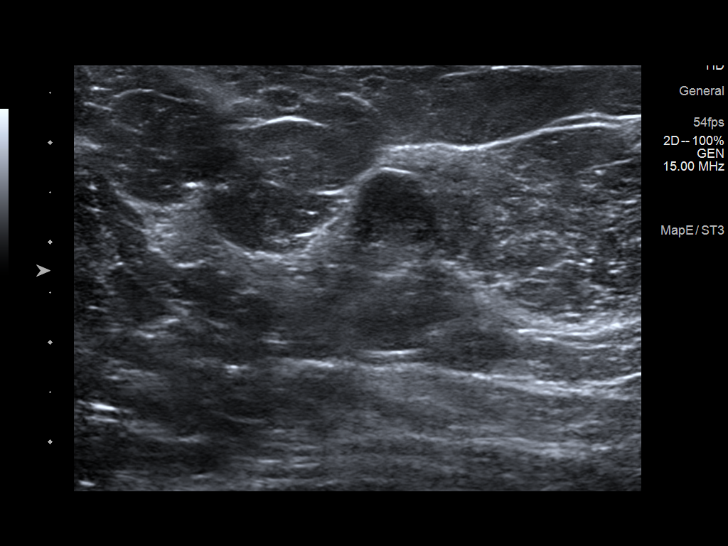
[im 10/12]
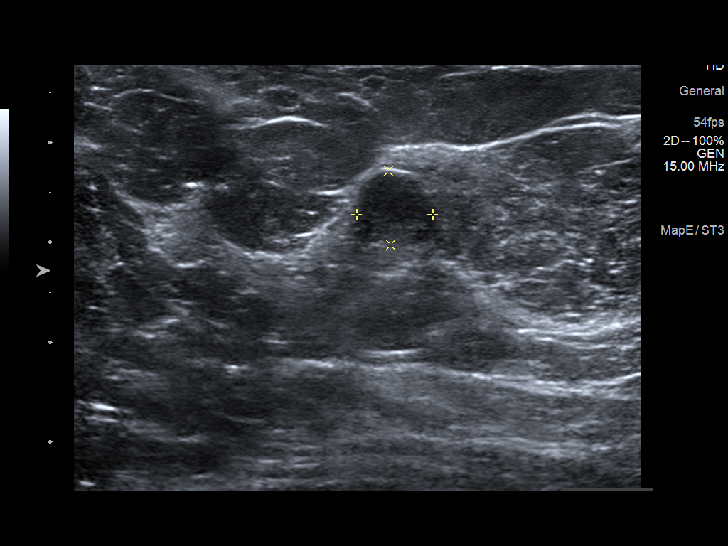
[im 11/12]
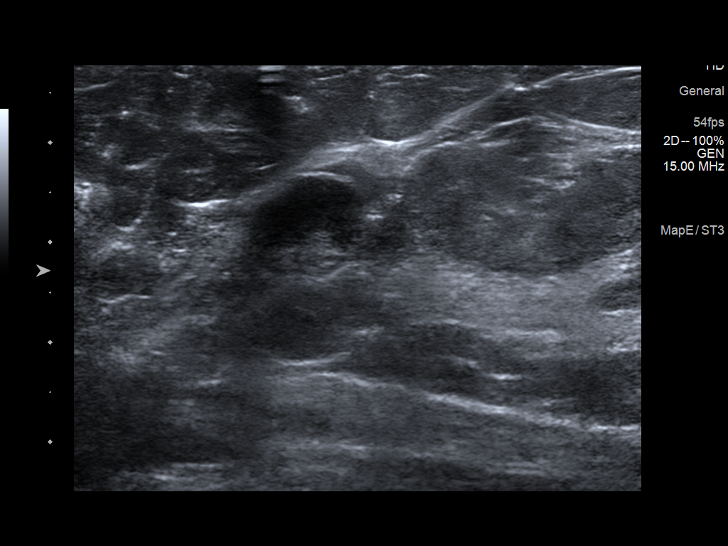
[im 12/12]
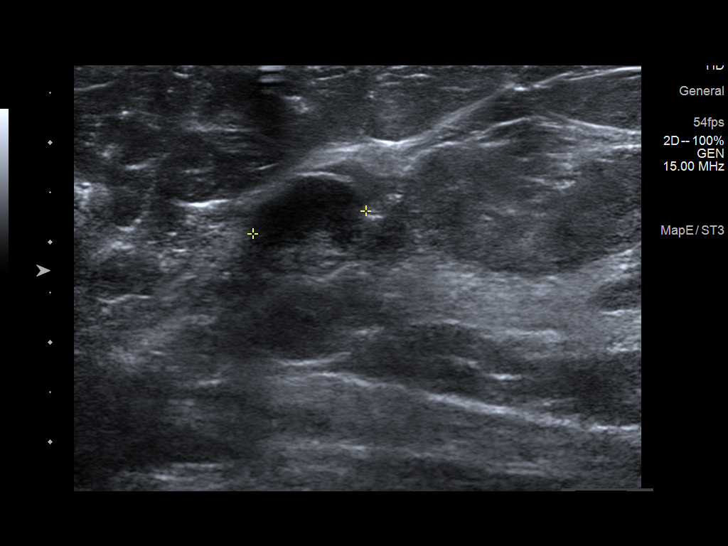

[12 of 12 positions shown; findings below may reference images not displayed]

ACR Breast Density Category c: The breast tissue is heterogeneously
dense, which may obscure small masses.
FINDINGS: The previously demonstrated area of ill-defined increased density in
the posterior aspect of the medial right breast, containing a ribbon
shaped biopsy marker clip, is significantly less prominent with no
mammographically defined abscess seen at this time.

The previously biopsied lymph node in the upper outer right breast,
containing a coil shaped biopsy marker clip, has not changed
significantly. The more posteriorly located enlarged lymph node in
the upper-outer quadrant of the right breast is slightly larger.

Mammographic images were processed with CAD.

On physical exam, the patient has an approximately 10 x 8 cm area of
brown skin discoloration in the lower inner quadrant of the right
breast with several small adjacent purplish areas of protruding skin
discoloration. There is an approximately 3 x 2 cm area of palpable
soft tissue thickening at the area of protruding skin discoloration
in the 4 o'clock position, 14 cm from the nipple.

There are 2 faintly palpable lymph nodes in the 10:30 o'clock
position of the right breast, 14 cm from the nipple.

Targeted ultrasound is performed, showing an elongated, thin fluid
collection in the skin in the 4 o'clock position of the right
breast, 14 cm from the nipple. This measures 3.5 x 3.1 x 0.4 cm in
maximum dimensions. There is an underlying ill-defined area of
increased echogenicity, measuring 3.5 x 3.1 x 1.3 cm.

In the 10:30 o'clock position of the right breast, 14 cm from the
nipple, the previously biopsied lymph node, currently containing a
biopsy marker clip, measures 1.3 x 0.9 x 0.8 cm. This measured 1.2 x
1.0 x 1.0 cm on 12/04/2017.

The more posteriorly located lymph node in the 10:30 o'clock
position of the right breast, 14 cm from the nipple, currently
measures 1.2 x 0.8 x 0.7 cm. On 12/04/2017, this measured 1.1 x
x 0.7 cm.
IMPRESSION: 1. Resolved abscess in the 4 o'clock position of the right breast
with a small amount of residual probable sterile fluid in the skin
at that location with underlying residual post inflammatory changes.
This does not require further follow-up if it continues to improve
clinically.
2. Mild decrease suppurative lymph in size of the previously
biopsied node in the 10:30 o'clock position of the right breast.
3. Mild increase in size of the 2nd probable reactive lymph node in
the 10:30 o'clock position of the right breast.

RECOMMENDATION:
Right breast ultrasound in 1 month to re-evaluate the 2nd probable
reactive lymph node in the 10:30 o'clock position of the right
breast.

I have discussed the findings and recommendations with the patient.
Results were also provided in writing at the conclusion of the
visit. If applicable, a reminder letter will be sent to the patient
regarding the next appointment.

BI-RADS CATEGORY  3: Probably benign.

## 2018-11-24 IMAGING — MG DIGITAL DIAGNOSTIC UNILATERAL RIGHT MAMMOGRAM WITH TOMO AND CAD
4 series · 5 of 12 positions shown · non-contrast
Comparison: Previous exam(s).

CLINICAL DATA: Followup aspirated abscess and ultrasound-guided
core needle biopsy in the 4 o'clock position of the right breast
with pathology results of densely inflamed fibroadipose tissue
consistent with abscess. Follow-up 2 abnormally enlarged lymph nodes
in the upper-outer quadrant of the right breast, 1 biopsied under
ultrasound guidance and demonstrating suppurative lymphadenitis. The
biopsies and aspiration were performed on 12/04/2017. The patient is
markedly improved symptomatically and finished a 10 day course of
doxycycline earlier this week.

EXAM:
DIGITAL DIAGNOSTIC RIGHT MAMMOGRAM WITH CAD AND TOMO
ULTRASOUND RIGHT BREAST

[R MLO synth-2D]
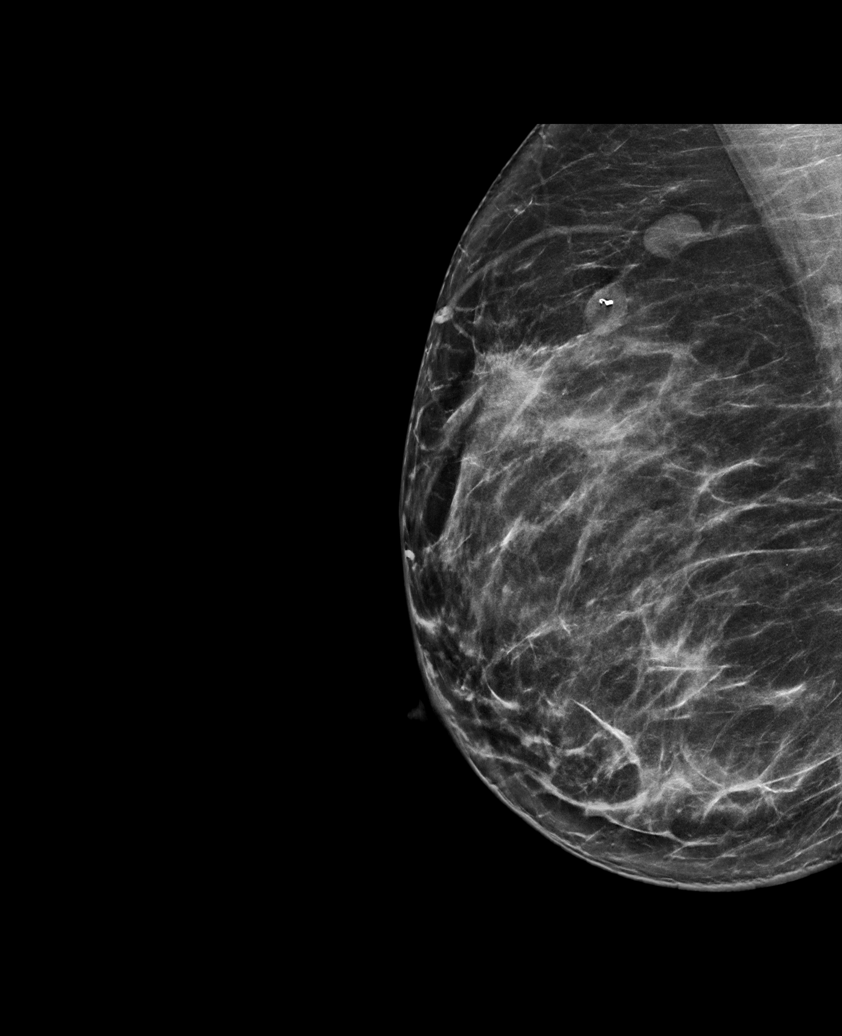

[R CC synth-2D]
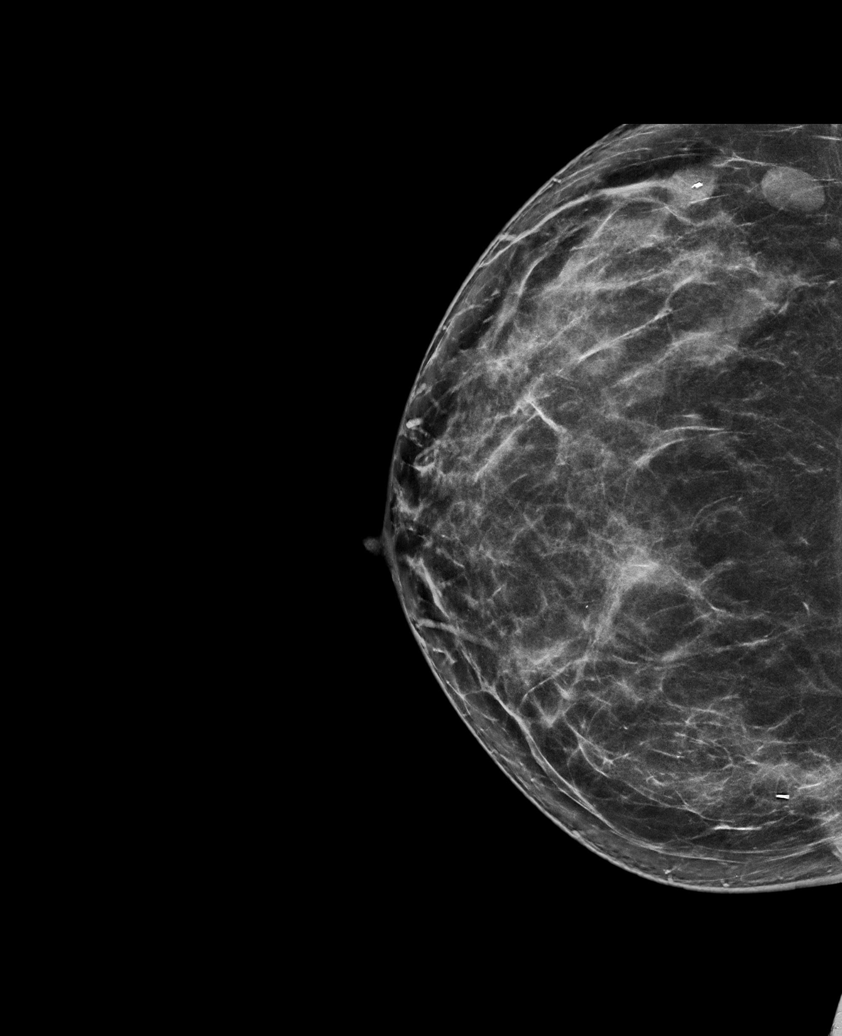

[R CC tomo · 2 of 84 frames shown]
[frame 28/84]
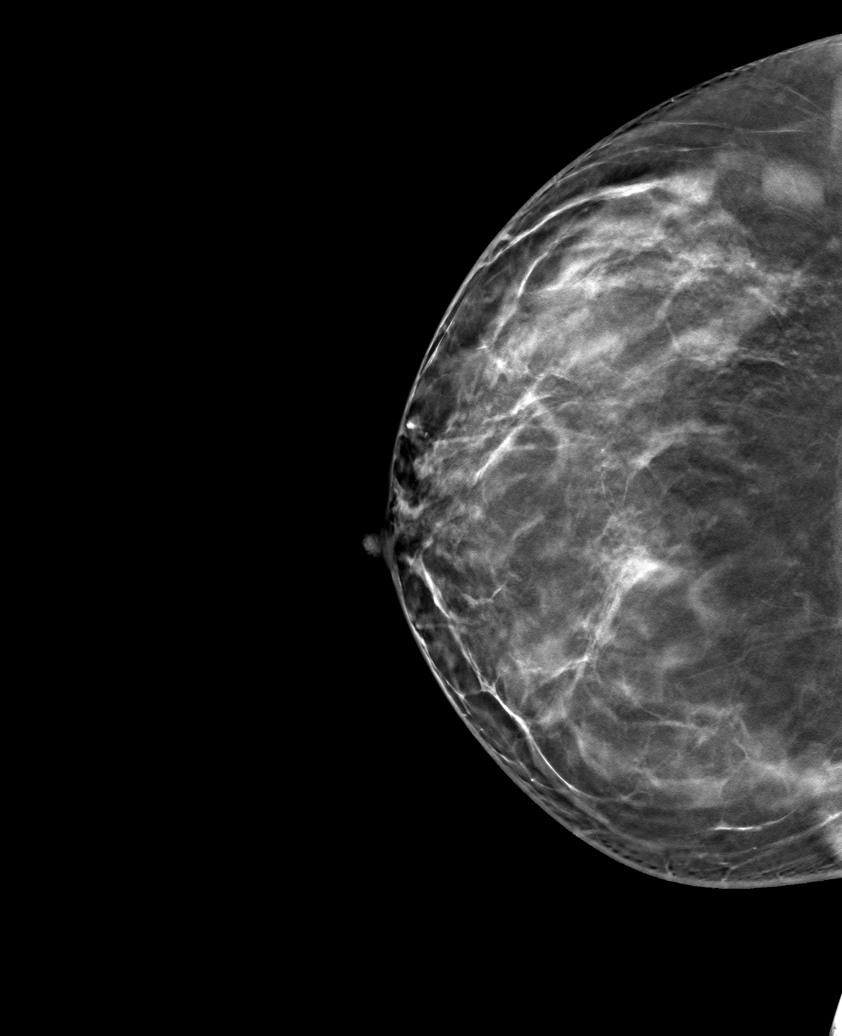
[frame 43/84]
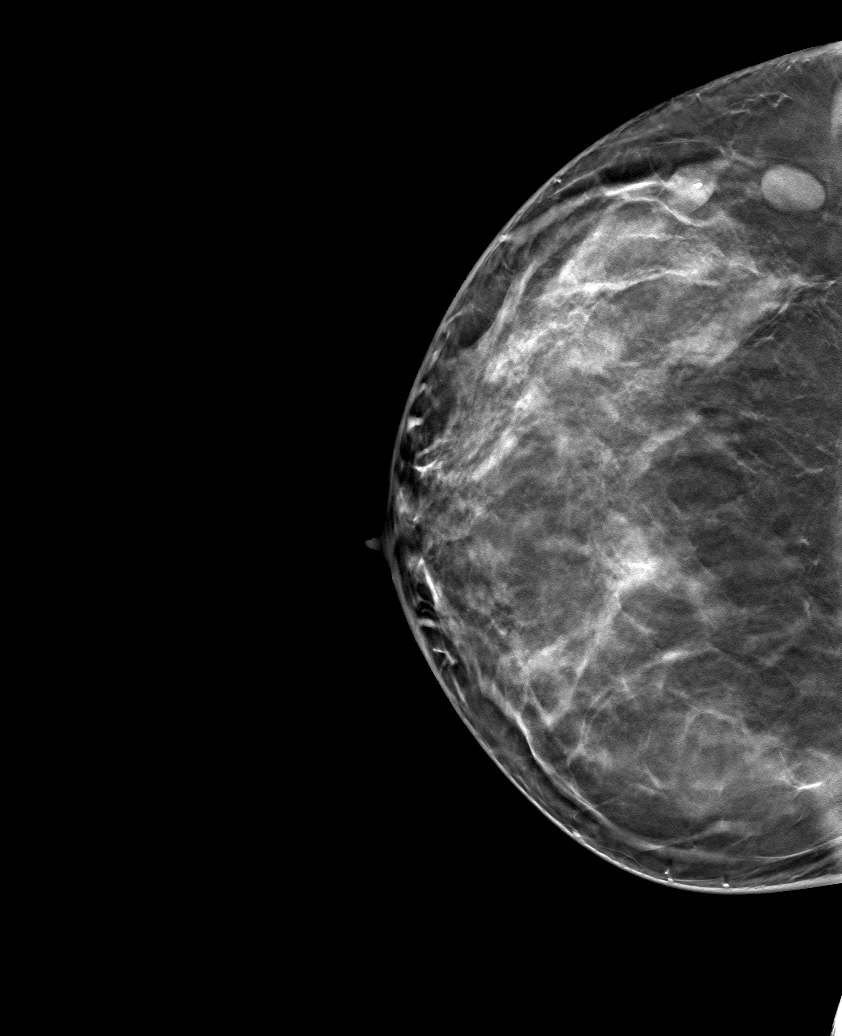

[R MLO tomo · tomo slice 45/88.0]
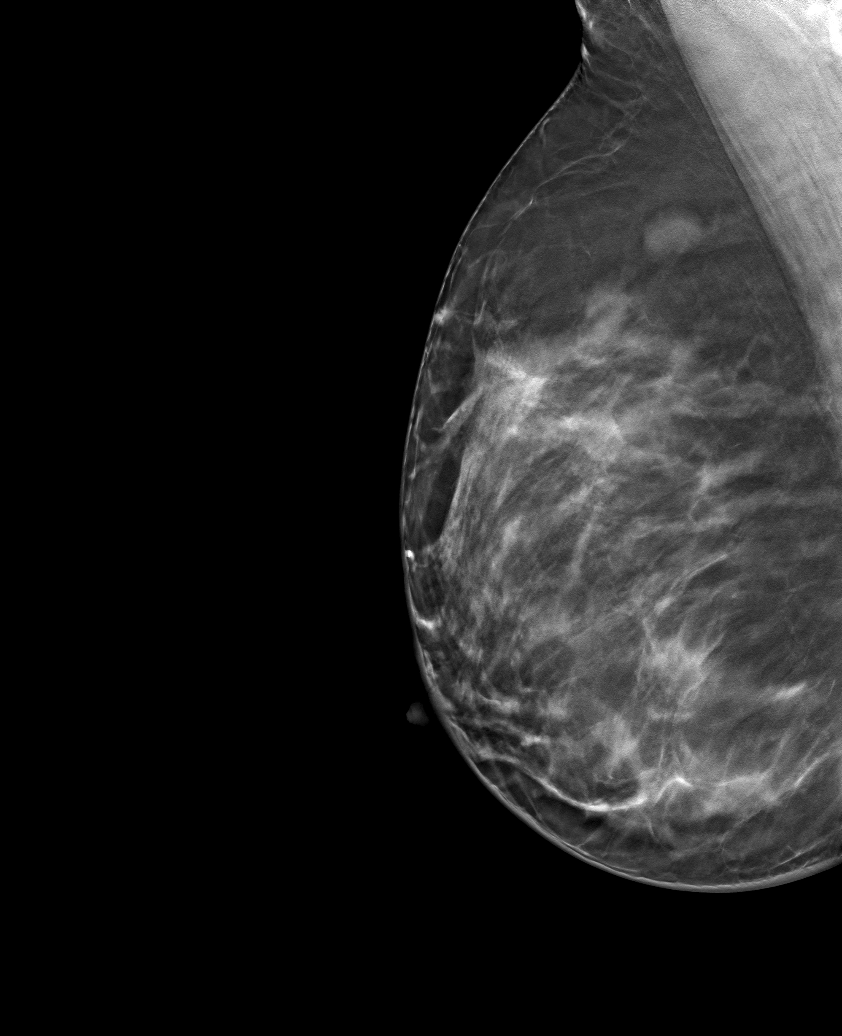

[5 of 12 positions shown; findings below may reference images not displayed]

ACR Breast Density Category c: The breast tissue is heterogeneously
dense, which may obscure small masses.
FINDINGS: The previously demonstrated area of ill-defined increased density in
the posterior aspect of the medial right breast, containing a ribbon
shaped biopsy marker clip, is significantly less prominent with no
mammographically defined abscess seen at this time.

The previously biopsied lymph node in the upper outer right breast,
containing a coil shaped biopsy marker clip, has not changed
significantly. The more posteriorly located enlarged lymph node in
the upper-outer quadrant of the right breast is slightly larger.

Mammographic images were processed with CAD.

On physical exam, the patient has an approximately 10 x 8 cm area of
brown skin discoloration in the lower inner quadrant of the right
breast with several small adjacent purplish areas of protruding skin
discoloration. There is an approximately 3 x 2 cm area of palpable
soft tissue thickening at the area of protruding skin discoloration
in the 4 o'clock position, 14 cm from the nipple.

There are 2 faintly palpable lymph nodes in the 10:30 o'clock
position of the right breast, 14 cm from the nipple.

Targeted ultrasound is performed, showing an elongated, thin fluid
collection in the skin in the 4 o'clock position of the right
breast, 14 cm from the nipple. This measures 3.5 x 3.1 x 0.4 cm in
maximum dimensions. There is an underlying ill-defined area of
increased echogenicity, measuring 3.5 x 3.1 x 1.3 cm.

In the 10:30 o'clock position of the right breast, 14 cm from the
nipple, the previously biopsied lymph node, currently containing a
biopsy marker clip, measures 1.3 x 0.9 x 0.8 cm. This measured 1.2 x
1.0 x 1.0 cm on 12/04/2017.

The more posteriorly located lymph node in the 10:30 o'clock
position of the right breast, 14 cm from the nipple, currently
measures 1.2 x 0.8 x 0.7 cm. On 12/04/2017, this measured 1.1 x
x 0.7 cm.
IMPRESSION: 1. Resolved abscess in the 4 o'clock position of the right breast
with a small amount of residual probable sterile fluid in the skin
at that location with underlying residual post inflammatory changes.
This does not require further follow-up if it continues to improve
clinically.
2. Mild decrease suppurative lymph in size of the previously
biopsied node in the 10:30 o'clock position of the right breast.
3. Mild increase in size of the 2nd probable reactive lymph node in
the 10:30 o'clock position of the right breast.

RECOMMENDATION:
Right breast ultrasound in 1 month to re-evaluate the 2nd probable
reactive lymph node in the 10:30 o'clock position of the right
breast.

I have discussed the findings and recommendations with the patient.
Results were also provided in writing at the conclusion of the
visit. If applicable, a reminder letter will be sent to the patient
regarding the next appointment.

BI-RADS CATEGORY  3: Probably benign.

## 2019-06-28 ENCOUNTER — Other Ambulatory Visit: Payer: Self-pay | Admitting: Obstetrics & Gynecology

## 2019-06-29 ENCOUNTER — Other Ambulatory Visit: Payer: Self-pay | Admitting: *Deleted

## 2019-06-29 MED ORDER — NORGESTIMATE-ETH ESTRADIOL 0.25-35 MG-MCG PO TABS: 1.0000 | ORAL_TABLET | Freq: Every day | ORAL | 3 refills | Status: DC

## 2019-10-26 ENCOUNTER — Other Ambulatory Visit: Payer: Self-pay

## 2019-10-26 ENCOUNTER — Encounter: Payer: Self-pay | Admitting: Advanced Practice Midwife

## 2019-10-26 ENCOUNTER — Ambulatory Visit (INDEPENDENT_AMBULATORY_CARE_PROVIDER_SITE_OTHER): Payer: 59 | Admitting: Advanced Practice Midwife

## 2019-10-26 ENCOUNTER — Other Ambulatory Visit: Payer: Self-pay | Admitting: Advanced Practice Midwife

## 2019-10-26 VITALS — BP 124/85 | HR 72 | Wt 189.4 lb

## 2019-10-26 DIAGNOSIS — Z113 Encounter for screening for infections with a predominantly sexual mode of transmission: Secondary | ICD-10-CM

## 2019-10-26 DIAGNOSIS — N898 Other specified noninflammatory disorders of vagina: Secondary | ICD-10-CM

## 2019-10-26 DIAGNOSIS — Z124 Encounter for screening for malignant neoplasm of cervix: Secondary | ICD-10-CM | POA: Diagnosis not present

## 2019-10-26 DIAGNOSIS — N760 Acute vaginitis: Secondary | ICD-10-CM | POA: Diagnosis not present

## 2019-10-26 DIAGNOSIS — Z1151 Encounter for screening for human papillomavirus (HPV): Secondary | ICD-10-CM | POA: Diagnosis not present

## 2019-10-26 DIAGNOSIS — B9689 Other specified bacterial agents as the cause of diseases classified elsewhere: Secondary | ICD-10-CM | POA: Diagnosis not present

## 2019-10-26 DIAGNOSIS — Z01419 Encounter for gynecological examination (general) (routine) without abnormal findings: Secondary | ICD-10-CM | POA: Diagnosis not present

## 2019-10-26 DIAGNOSIS — E669 Obesity, unspecified: Secondary | ICD-10-CM

## 2019-10-26 DIAGNOSIS — N611 Abscess of the breast and nipple: Secondary | ICD-10-CM

## 2019-10-26 NOTE — Progress Notes (Signed)
STI TESTING TODAY  Last pap 2017- normal

## 2019-10-26 NOTE — Patient Instructions (Signed)
Preventive Care 21-33 Years Old, Female Preventive care refers to visits with your health care provider and lifestyle choices that can promote health and wellness. This includes:  A yearly physical exam. This may also be called an annual well check.  Regular dental visits and eye exams.  Immunizations.  Screening for certain conditions.  Healthy lifestyle choices, such as eating a healthy diet, getting regular exercise, not using drugs or products that contain nicotine and tobacco, and limiting alcohol use. What can I expect for my preventive care visit? Physical exam Your health care provider will check your:  Height and weight. This may be used to calculate body mass index (BMI), which tells if you are at a healthy weight.  Heart rate and blood pressure.  Skin for abnormal spots. Counseling Your health care provider may ask you questions about your:  Alcohol, tobacco, and drug use.  Emotional well-being.  Home and relationship well-being.  Sexual activity.  Eating habits.  Work and work environment.  Method of birth control.  Menstrual cycle.  Pregnancy history. What immunizations do I need?  Influenza (flu) vaccine  This is recommended every year. Tetanus, diphtheria, and pertussis (Tdap) vaccine  You may need a Td booster every 10 years. Varicella (chickenpox) vaccine  You may need this if you have not been vaccinated. Human papillomavirus (HPV) vaccine  If recommended by your health care provider, you may need three doses over 6 months. Measles, mumps, and rubella (MMR) vaccine  You may need at least one dose of MMR. You may also need a second dose. Meningococcal conjugate (MenACWY) vaccine  One dose is recommended if you are age 19-21 years and a first-year college student living in a residence hall, or if you have one of several medical conditions. You may also need additional booster doses. Pneumococcal conjugate (PCV13) vaccine  You may need  this if you have certain conditions and were not previously vaccinated. Pneumococcal polysaccharide (PPSV23) vaccine  You may need one or two doses if you smoke cigarettes or if you have certain conditions. Hepatitis A vaccine  You may need this if you have certain conditions or if you travel or work in places where you may be exposed to hepatitis A. Hepatitis B vaccine  You may need this if you have certain conditions or if you travel or work in places where you may be exposed to hepatitis B. Haemophilus influenzae type b (Hib) vaccine  You may need this if you have certain conditions. You may receive vaccines as individual doses or as more than one vaccine together in one shot (combination vaccines). Talk with your health care provider about the risks and benefits of combination vaccines. What tests do I need?  Blood tests  Lipid and cholesterol levels. These may be checked every 5 years starting at age 20.  Hepatitis C test.  Hepatitis B test. Screening  Diabetes screening. This is done by checking your blood sugar (glucose) after you have not eaten for a while (fasting).  Sexually transmitted disease (STD) testing.  BRCA-related cancer screening. This may be done if you have a family history of breast, ovarian, tubal, or peritoneal cancers.  Pelvic exam and Pap test. This may be done every 3 years starting at age 21. Starting at age 30, this may be done every 5 years if you have a Pap test in combination with an HPV test. Talk with your health care provider about your test results, treatment options, and if necessary, the need for more tests.   Follow these instructions at home: Eating and drinking   Eat a diet that includes fresh fruits and vegetables, whole grains, lean protein, and low-fat dairy.  Take vitamin and mineral supplements as recommended by your health care provider.  Do not drink alcohol if: ? Your health care provider tells you not to drink. ? You are  pregnant, may be pregnant, or are planning to become pregnant.  If you drink alcohol: ? Limit how much you have to 0-1 drink a day. ? Be aware of how much alcohol is in your drink. In the [place] one drink equals one 12 oz bottle of beer (355 mL), one 5 oz glass of wine (148 mL), or one 1 oz glass of hard liquor (44 mL). Lifestyle  Take daily care of your teeth and gums.  Stay active. Exercise for at least 30 minutes on 5 or more days each week.  Do not use any products that contain nicotine or tobacco, such as cigarettes, e-cigarettes, and chewing tobacco. If you need help quitting, ask your health care provider.  If you are sexually active, practice safe sex. Use a condom or other form of birth control (contraception) in order to prevent pregnancy and STIs (sexually transmitted infections). If you plan to become pregnant, see your health care provider for a preconception visit. What's next?  Visit your health care provider once a year for a well check visit.  Ask your health care provider how often you should have your eyes and teeth checked.  Stay up to date on all vaccines. This information is not intended to replace advice given to you by your health care provider. Make sure you discuss any questions you have with your health care provider. Document Revised: 04/22/2018 Document Reviewed: 04/22/2018 Elsevier Patient Education  2020 [employer].

## 2019-10-26 NOTE — Progress Notes (Signed)
GYNECOLOGY ANNUAL PREVENTATIVE CARE ENCOUNTER NOTE  History:     Bailey Mendoza is a 34 y.o. G0P0000 female here for a routine annual gynecologic exam.  Current complaints: patient has noted new small lumps at 9n o clock on her left breast. She also continue to experience tenderness at the site of her biopsy three years ago. She is s/p evaluation and management at [facility] and requests a new referral for that team. Denies abnormal vaginal bleeding, discharge, pelvic pain, problems with intercourse or other gynecologic concerns.     Non-smoker, monogamous with partner of 5 years. Denies SI, HI, IPV. Gynecologic History Patient's last menstrual period was 10/15/2019. Contraception: OCP (estrogen/progesterone) Last Pap: 04/10/2016. Results were: normal with negative HPV Last mammogram: 11/2017. Results were: abnormal  Obstetric History OB History  Gravida Para Term Preterm AB Living  0 0 0 0 0 0  SAB TAB Ectopic Multiple Live Births  0 0 0 0      No past medical history on file.  No past surgical history on file.  Current Outpatient Medications on File Prior to Visit  Medication Sig Dispense Refill  . norgestimate-ethinyl estradiol (ESTARYLLA) 0.25-35 MG-MCG tablet Take 1 tablet by mouth daily. 84 tablet 3  . HYDROcodone-acetaminophen (NORCO/VICODIN) 5-325 MG tablet Take 1 tablet by mouth every 6 (six) hours as needed. (Patient not taking: Reported on 12/02/2017) 10 tablet 0   No current facility-administered medications on file prior to visit.    No Known Allergies  Social History:  reports that she has never smoked. She has never used smokeless tobacco. She reports current alcohol use. She reports that she does not use drugs.  Family History  Problem Relation Age of Onset  . Hypertension Mother   . Diabetes Maternal Grandfather   . Diabetes Paternal Grandfather   . Breast cancer Neg Hx     The following portions of the patient's history were reviewed and  updated as appropriate: allergies, current medications, past family history, past medical history, past social history, past surgical history and problem list.  Review of Systems Pertinent items noted in HPI and remainder of comprehensive ROS otherwise negative.  Physical Exam:  BP 124/85   Pulse 72   Wt 189 lb 6.4 oz (85.9 kg)   LMP 10/15/2019   BMI 33.55 kg/m  CONSTITUTIONAL: Well-developed, well-nourished female in no acute distress.  HENT:  Normocephalic, atraumatic, External right and left ear normal. Oropharynx is clear and moist EYES: Conjunctivae and EOM are normal. Pupils are equal, round, and reactive to light. No scleral icterus.  NECK: Normal range of motion, supple, no masses.  Normal thyroid.  SKIN: Skin is warm and dry. No rash noted. Not diaphoretic. No erythema. No pallor. MUSCULOSKELETAL: Normal range of motion. No tenderness.  No cyanosis, clubbing, or edema.  2+ distal pulses. NEUROLOGIC: Alert and oriented to person, place, and time. Normal reflexes, muscle tone coordination.  PSYCHIATRIC: Normal mood and affect. Normal behavior. Normal judgment and thought content. CARDIOVASCULAR: Normal heart rate noted, regular rhythm RESPIRATORY: Clear to auscultation bilaterally. Effort and breath sounds normal, no problems with respiration noted. BREASTS: Symmetric in size. No masses, tenderness, skin changes, nipple drainage, or lymphadenopathy bilaterally. ABDOMEN: Soft, no distention noted.  No tenderness, rebound or guarding.  PELVIC: Normal appearing external genitalia and urethral meatus; normal appearing vaginal mucosa and cervix.  No abnormal discharge noted.  Pap smear obtained.  Normal uterine size, no other palpable masses, no uterine or adnexal tenderness.  Assessment and Plan:    1. Well woman exam with routine gynecological exam - Previously diagnosed with lymphadenitis upper outer quadrant - CBC - TSH - Hemoglobin A1c - Comprehensive metabolic panel - Lipid  panel - RPR - HIV Antibody (routine testing w rflx) - Hepatitis B surface antigen - Cytology - PAP( [place]) - Cervicovaginal ancillary only( [place]) - MM DIAG BREAST TOMO BILATERAL; Future  2. Breast abscess - Referral renewed. [facility] team coordinating follow up with [place]  3. Screen for STD (sexually transmitted disease) - Per patient request  4. Obesity (BMI 30.0-34.9) - Well woman healh maintenance labs today - Patient needs PCP referral  Will follow up results of pap smear and manage accordingly. Mammogram scheduled Routine preventative health maintenance measures emphasized. Please refer to After Visit Summary for other counseling recommendations.      Mallie Snooks, MSN, CNM Certified Nurse Midwife, [facility] for [employer], [place] [facility] 10/26/19 8:37 PM

## 2019-10-27 ENCOUNTER — Other Ambulatory Visit: Payer: Self-pay

## 2019-10-27 ENCOUNTER — Ambulatory Visit
Admission: RE | Admit: 2019-10-27 | Discharge: 2019-10-27 | Disposition: A | Discharge status: Home / Self Care | Payer: 59 | Source: Ambulatory Visit | Attending: Advanced Practice Midwife | Admitting: Advanced Practice Midwife

## 2019-10-27 ENCOUNTER — Ambulatory Visit: Payer: 59

## 2019-10-27 DIAGNOSIS — Z01419 Encounter for gynecological examination (general) (routine) without abnormal findings: Secondary | ICD-10-CM

## 2019-10-27 LAB — COMPREHENSIVE METABOLIC PANEL
ALT: 19 IU/L (ref 0–32)
AST: 13 IU/L (ref 0–40)
Albumin/Globulin Ratio: 1.4 (ref 1.2–2.2)
Albumin: 4.4 g/dL (ref 3.8–4.8)
Alkaline Phosphatase: 94 IU/L (ref 39–117)
BUN/Creatinine Ratio: 11 (ref 9–23)
BUN: 9 mg/dL (ref 6–20)
Bilirubin Total: 0.2 mg/dL (ref 0.0–1.2)
CO2: 17 mmol/L — ABNORMAL LOW (ref 20–29)
Calcium: 9.3 mg/dL (ref 8.7–10.2)
Chloride: 101 mmol/L (ref 96–106)
Creatinine, Ser: 0.82 mg/dL (ref 0.57–1.00)
GFR calc Af Amer: 109 mL/min/{1.73_m2} (ref >59)
GFR calc non Af Amer: 95 mL/min/{1.73_m2} (ref >59)
Globulin, Total: 3.2 g/dL (ref 1.5–4.5)
Glucose: 84 mg/dL (ref 65–99)
Potassium: 4.2 mmol/L (ref 3.5–5.2)
Sodium: 136 mmol/L (ref 134–144)
Total Protein: 7.6 g/dL (ref 6.0–8.5)

## 2019-10-27 LAB — RPR: RPR Ser Ql: NONREACTIVE

## 2019-10-27 LAB — HEPATITIS B SURFACE ANTIGEN: Hepatitis B Surface Ag: NEGATIVE

## 2019-10-27 LAB — LIPID PANEL
Chol/HDL Ratio: 2.9 ratio (ref 0.0–4.4)
Cholesterol, Total: 152 mg/dL (ref 100–199)
HDL: 53 mg/dL (ref >39)
LDL Chol Calc (NIH): 81 mg/dL (ref 0–99)
Triglycerides: 100 mg/dL (ref 0–149)
VLDL Cholesterol Cal: 18 mg/dL (ref 5–40)

## 2019-10-27 LAB — HEMOGLOBIN A1C
Est. average glucose Bld gHb Est-mCnc: 111 mg/dL
Hgb A1c MFr Bld: 5.5 % (ref 4.8–5.6)

## 2019-10-27 LAB — HIV ANTIBODY (ROUTINE TESTING W REFLEX): HIV Screen 4th Generation wRfx: NONREACTIVE

## 2019-10-27 LAB — CBC
Hematocrit: 37 % (ref 34.0–46.6)
Hemoglobin: 13 g/dL (ref 11.1–15.9)
MCH: 31 pg (ref 26.6–33.0)
MCHC: 35.1 g/dL (ref 31.5–35.7)
MCV: 88 fL (ref 79–97)
Platelets: 423 10*3/uL (ref 150–450)
RBC: 4.19 x10E6/uL (ref 3.77–5.28)
RDW: 12.6 % (ref 11.7–15.4)
WBC: 12.7 10*3/uL — ABNORMAL HIGH (ref 3.4–10.8)

## 2019-10-27 LAB — TSH: TSH: 1.35 u[IU]/mL (ref 0.450–4.500)

## 2019-10-28 LAB — CERVICOVAGINAL ANCILLARY ONLY
Bacterial Vaginitis (gardnerella): POSITIVE — AB
Candida Glabrata: NEGATIVE
Candida Vaginitis: NEGATIVE
Chlamydia: NEGATIVE
Comment: NEGATIVE
Comment: NEGATIVE
Comment: NEGATIVE
Comment: NEGATIVE
Comment: NEGATIVE
Comment: NORMAL
Neisseria Gonorrhea: NEGATIVE
Trichomonas: NEGATIVE

## 2019-10-28 LAB — CYTOLOGY - PAP
Comment: NEGATIVE
Diagnosis: NEGATIVE
High risk HPV: NEGATIVE

## 2019-10-31 ENCOUNTER — Other Ambulatory Visit: Payer: Self-pay

## 2019-10-31 ENCOUNTER — Telehealth: Payer: Self-pay

## 2019-10-31 ENCOUNTER — Other Ambulatory Visit: Payer: Self-pay | Admitting: Advanced Practice Midwife

## 2019-10-31 MED ORDER — METRONIDAZOLE 500 MG PO TABS: 500.0000 mg | ORAL_TABLET | Freq: Two times a day (BID) | ORAL | 0 refills | Status: DC

## 2019-10-31 NOTE — Telephone Encounter (Signed)
Received a message from the on call nurse. Patient would like flagyl to be called into her pharmacy to help with BV.

## 2019-10-31 NOTE — Progress Notes (Signed)
Flagyl prescription coordinated with [facility] staff.  Mallie Snooks, MSN, CNM Certified Nurse Midwife, [employer] for [employer], [place] [facility] 10/31/19 1:47 PM

## 2020-03-17 ENCOUNTER — Ambulatory Visit
Admission: EM | Admit: 2020-03-17 | Discharge: 2020-03-17 | Disposition: A | Discharge status: Home / Self Care | Payer: 59 | Attending: Emergency Medicine | Admitting: Emergency Medicine

## 2020-03-17 ENCOUNTER — Other Ambulatory Visit: Payer: Self-pay

## 2020-03-17 ENCOUNTER — Encounter: Payer: Self-pay | Admitting: Emergency Medicine

## 2020-03-17 DIAGNOSIS — R059 Cough, unspecified: Secondary | ICD-10-CM

## 2020-03-17 DIAGNOSIS — R05 Cough: Secondary | ICD-10-CM | POA: Diagnosis not present

## 2020-03-17 DIAGNOSIS — H66002 Acute suppurative otitis media without spontaneous rupture of ear drum, left ear: Secondary | ICD-10-CM

## 2020-03-17 DIAGNOSIS — J069 Acute upper respiratory infection, unspecified: Secondary | ICD-10-CM

## 2020-03-17 MED ORDER — BENZONATATE 100 MG PO CAPS: 100.0000 mg | ORAL_CAPSULE | Freq: Three times a day (TID) | ORAL | 0 refills | Status: DC

## 2020-03-17 MED ORDER — AMOXICILLIN-POT CLAVULANATE 875-125 MG PO TABS: 1.0000 | ORAL_TABLET | Freq: Two times a day (BID) | ORAL | 0 refills | Status: AC

## 2020-03-17 NOTE — ED Provider Notes (Signed)
[place]   [id number] 03/17/20 Arrival Time: 0901   CC: URI  SUBJECTIVE: History from: patient.  Bailey Mendoza is a 34 y.o. female who presents with cold chills, fatigue, LT ear pain, runny nose, congestion, and clear productive cough x 1 week.  Sister with cold symptoms.  Had negative rapid COVID 2 days ago.  Has tried OTC medications without relief.  Symptoms are made worse in the AM.  Reports previous symptoms in the past.   Denies fever, sore throat, SOB, wheezing, chest pain, nausea, vomiting, changes in bowel or bladder habits.    ROS: As per HPI.  All other pertinent ROS negative.     History reviewed. No pertinent past medical history. Past Surgical History:  Procedure Laterality Date  . BREAST BIOPSY Right 2019   No Known Allergies No current facility-administered medications on file prior to encounter.   Current Outpatient Medications on File Prior to Encounter  Medication Sig Dispense Refill  . norgestimate-ethinyl estradiol (ESTARYLLA) 0.25-35 MG-MCG tablet Take 1 tablet by mouth daily. 84 tablet 3   Social History   Socioeconomic History  . Marital status: Single    Spouse name: Not on file  . Number of children: Not on file  . Years of education: Not on file  . Highest education level: Not on file  Occupational History  . Not on file  Tobacco Use  . Smoking status: Never Smoker  . Smokeless tobacco: Never Used  Substance and Sexual Activity  . Alcohol use: Yes    Comment: social  . Drug use: No  . Sexual activity: Yes    Partners: Male    Birth control/protection: Pill  Other Topics Concern  . Not on file  Social History Narrative  . Not on file   Social Determinants of Health   Financial Resource Strain:   . Difficulty of Paying Living Expenses:   Food Insecurity:   . Worried About Charity fundraiser in the Last Year:   . Arboriculturist in the Last Year:   Transportation Needs:   . Film/video editor (Medical):   Marland Kitchen  Lack of Transportation (Non-Medical):   Physical Activity:   . Days of Exercise per Week:   . Minutes of Exercise per Session:   Stress:   . Feeling of Stress :   Social Connections:   . Frequency of Communication with Friends and Family:   . Frequency of Social Gatherings with Friends and Family:   . Attends Religious Services:   . Active Member of Clubs or Organizations:   . Attends Archivist Meetings:   Marland Kitchen Marital Status:   Intimate Partner Violence:   . Fear of Current or Ex-Partner:   . Emotionally Abused:   Marland Kitchen Physically Abused:   . Sexually Abused:    Family History  Problem Relation Age of Onset  . Hypertension Mother   . Diabetes Maternal Grandfather   . Diabetes Paternal Grandfather   . Breast cancer Neg Hx     OBJECTIVE:  Vitals:   03/17/20 0921  BP: (!) 124/96  Pulse: 99  Resp: 18  Temp: 99.6 F (37.6 C)  TempSrc: Oral  SpO2: 96%     General appearance: alert; appears mildly fatigued, but nontoxic; speaking in full sentences and tolerating own secretions HEENT: NCAT; Ears: EACs clear, RT TM pearly gray, LT TM erythematous; Eyes: PERRL.  EOM grossly intact. Sinuses: nontender; Nose: nares patent without rhinorrhea, Throat: oropharynx clear, tonsils non erythematous  or enlarged, uvula midline  Neck: supple without LAD Lungs: unlabored respirations, symmetrical air entry; cough: absent; no respiratory distress; CTAB Heart: regular rate and rhythm.   Skin: warm and dry Psychological: alert and cooperative; normal mood and affect   ASSESSMENT & PLAN:  1. Cough   2. Viral URI with cough   3. Non-recurrent acute suppurative otitis media of left ear without spontaneous rupture of tympanic membrane     Meds ordered this encounter  Medications  . amoxicillin-clavulanate (AUGMENTIN) 875-125 MG tablet    Sig: Take 1 tablet by mouth every 12 (twelve) hours for 10 days.    Dispense:  20 tablet    Refill:  0    Order Specific Question:   Supervising  Provider    Answer:   Raylene Everts [[id number]]  . benzonatate (TESSALON) 100 MG capsule    Sig: Take 1 capsule (100 mg total) by mouth every 8 (eight) hours.    Dispense:  21 capsule    Refill:  0    Order Specific Question:   Supervising Provider    Answer:   Raylene Everts [[id number]]    Get plenty of rest and push fluids Augmentin prescribed.  Take as directed and to completion Tessalon Perles prescribed for cough Use OTC zyrtec for nasal congestion, runny nose, and/or sore throat Use OTC flonase for nasal congestion and runny nose Use medications daily for symptom relief Use OTC medications like ibuprofen or tylenol as needed fever or pain Follow up with PCP as needed Return or go to the ED if you have any new or worsening symptoms such as fever, worsening cough, shortness of breath, chest tightness, chest pain, turning blue, changes in mental status, etc...   Reviewed expectations re: course of current medical issues. Questions answered. Outlined signs and symptoms indicating need for more acute intervention. Patient verbalized understanding. After Visit Summary given.         Lestine Box, PA-C 03/17/20 [phone]

## 2020-03-17 NOTE — ED Triage Notes (Signed)
Pt sts cough, body aches and earache x several days; pt sts had negative rapid covid on Thursday; pt sts some chills also

## 2020-03-17 NOTE — Discharge Instructions (Signed)
Get plenty of rest and push fluids Augmentin prescribed.  Take as directed and to completion Tessalon Perles prescribed for cough Use OTC zyrtec for nasal congestion, runny nose, and/or sore throat Use OTC flonase for nasal congestion and runny nose Use medications daily for symptom relief Use OTC medications like ibuprofen or tylenol as needed fever or pain Follow up with PCP as needed Return or go to the ED if you have any new or worsening symptoms such as fever, worsening cough, shortness of breath, chest tightness, chest pain, turning blue, changes in mental status, etc..Marland Kitchen

## 2020-03-18 LAB — NOVEL CORONAVIRUS, NAA: SARS-CoV-2, NAA: DETECTED — AB

## 2020-03-18 LAB — SARS-COV-2, NAA 2 DAY TAT

## 2020-03-19 ENCOUNTER — Telehealth: Payer: Self-pay | Admitting: Physician Assistant

## 2020-03-19 NOTE — Telephone Encounter (Signed)
Called to discuss with Oswaldo Milian about Covid symptoms and the use of bamlanivimab/etesevimab or casirivimab/imdevimab, a monoclonal antibody infusion for those with mild to moderate Covid symptoms and at a high risk of hospitalization.     Pt is qualified for this infusion at the monoclonal antibody infusion center due to co-morbid conditions and/or a member of an at-risk group, however declines infusion at this time. Symptoms tier reviewed as well as criteria for ending isolation.  Symptoms reviewed that would warrant ED/Hospital evaluation. Preventative practices reviewed. Patient verbalized understanding. Patient advised to call back if he decides that he does want to get infusion. Callback number to the infusion center given. Patient advised to go to Urgent care or ED with severe symptoms. Last date pt would be eligible for infusion is 7/30.    Patient Active Problem List   Diagnosis Date Noted  . Well woman exam with routine gynecological exam 12/02/2017  . Obesity (BMI 30.0-34.9) 12/02/2017    Angelena Form PA-C

## 2020-09-03 ENCOUNTER — Other Ambulatory Visit: Payer: Self-pay | Admitting: Obstetrics & Gynecology

## 2020-09-11 ENCOUNTER — Encounter: Payer: Self-pay | Admitting: Radiology

## 2021-04-16 ENCOUNTER — Ambulatory Visit (INDEPENDENT_AMBULATORY_CARE_PROVIDER_SITE_OTHER): Payer: 59 | Admitting: Obstetrics and Gynecology

## 2021-04-16 ENCOUNTER — Other Ambulatory Visit (HOSPITAL_COMMUNITY)
Admission: RE | Admit: 2021-04-16 | Discharge: 2021-04-16 | Disposition: A | Discharge status: Home / Self Care | Payer: 59 | Source: Ambulatory Visit | Attending: Obstetrics and Gynecology | Admitting: Obstetrics and Gynecology

## 2021-04-16 ENCOUNTER — Other Ambulatory Visit: Payer: Self-pay

## 2021-04-16 ENCOUNTER — Encounter: Payer: Self-pay | Admitting: Obstetrics and Gynecology

## 2021-04-16 VITALS — BP 139/89 | HR 89 | Ht 62.0 in | Wt 195.8 lb

## 2021-04-16 DIAGNOSIS — Z113 Encounter for screening for infections with a predominantly sexual mode of transmission: Secondary | ICD-10-CM

## 2021-04-16 DIAGNOSIS — Z872 Personal history of diseases of the skin and subcutaneous tissue: Secondary | ICD-10-CM | POA: Diagnosis not present

## 2021-04-16 DIAGNOSIS — Z0000 Encounter for general adult medical examination without abnormal findings: Secondary | ICD-10-CM | POA: Insufficient documentation

## 2021-04-16 DIAGNOSIS — Z01419 Encounter for gynecological examination (general) (routine) without abnormal findings: Secondary | ICD-10-CM

## 2021-04-16 DIAGNOSIS — Z202 Contact with and (suspected) exposure to infections with a predominantly sexual mode of transmission: Secondary | ICD-10-CM

## 2021-04-16 DIAGNOSIS — N643 Galactorrhea not associated with childbirth: Secondary | ICD-10-CM

## 2021-04-16 MED ORDER — NORGESTIMATE-ETH ESTRADIOL 0.25-35 MG-MCG PO TABS: 1.0000 | ORAL_TABLET | Freq: Every day | ORAL | 3 refills | Status: DC

## 2021-04-16 NOTE — Progress Notes (Signed)
Pt states breast are leaking milk.   Refill b/c

## 2021-04-16 NOTE — Progress Notes (Signed)
Obstetrics and Gynecology Annual Patient Evaluation  Appointment Date: 04/16/2021  OBGYN Clinic: [facility]  Primary Care Provider: Patient, No Pcp Per (Inactive)   Chief Complaint:  Chief Complaint  Patient presents with   Gynecologic Exam  Galactorrhea  History of Present Illness: Bailey Mendoza is a 35 y.o. G0 seen for the above chief complaint.  GYN annual: pt desires refill on OCPs; she hasn't used them in past few months. Desires STD screen  Galactorrhea: patient has b/l s/s for past few months and it has been increasing. H/o right breast abscess a few years ago that was negative. She states galactorrhea is left>right; always has looked clear or milky with no blood, brown, black, green; no lumps/bumps; nttp; normal appearing skin; she had s/s when she was using OCPs; she thinks maybe in the past she had some nipple d/c that went away but she isn't certain. She states that she does not overly stimulate her nipples.    Review of Systems: Pertinent items noted in HPI and remainder of comprehensive ROS otherwise negative.   Past Medical History:  No past medical history on file.  Past Surgical History:  Past Surgical History:  Procedure Laterality Date   BREAST BIOPSY Right 2019    Past Obstetrical History:  OB History  Gravida Para Term Preterm AB Living  0 0 0 0 0 0  SAB IAB Ectopic Multiple Live Births  0 0 0 0      Past Gynecological History: As per HPI. Periods: qmonth, regular, not heavy or painful, <1wk History of Pap Smear(s): Yes.   Last pap 10/2019, which was negative  Social History:  Social History   Socioeconomic History   Marital status: Single    Spouse name: Not on file   Number of children: Not on file   Years of education: Not on file   Highest education level: Not on file  Occupational History   Not on file  Tobacco Use   Smoking status: Never   Smokeless tobacco: Never  Substance and Sexual Activity    Alcohol use: Yes    Comment: social   Drug use: No   Sexual activity: Yes    Partners: Male    Birth control/protection: Pill  Other Topics Concern   Not on file  Social History Narrative   Not on file   Social Determinants of Health   Financial Resource Strain: Not on file  Food Insecurity: Not on file  Transportation Needs: Not on file  Physical Activity: Not on file  Stress: Not on file  Social Connections: Not on file  Intimate Partner Violence: Not on file    Family History:  Family History  Problem Relation Age of Onset   Hypertension Mother    Diabetes Maternal Grandfather    Diabetes Paternal Grandfather    Breast cancer Paternal Aunt >50    Medications None  Allergies Patient has no known allergies.   Physical Exam:  BP 139/89   Pulse 89   Ht '5\' 2"'$  (1.575 m)   Wt 195 lb 12.8 oz (88.8 kg)   BMI 35.81 kg/m  Body mass index is 35.81 kg/m.  General appearance: Well nourished, well developed female in no acute distress.  Neck:  Supple, normal appearance, and no thyromegaly  Cardiovascular: normal s1 and s2.  No murmurs, rubs or gallops. Respiratory:  Clear to auscultation bilateral. Normal respiratory effort Abdomen: positive bowel sounds and no masses, hernias; diffusely non tender to palpation, non  distended Breasts: no masses, skin abnormalities, lymph nodes. Normal nipples. On expression, bilaterally, clear/white fluid came out various duct openings Neuro/Psych:  Normal mood and affect.  Skin:  Warm and dry.   Pelvic exam: patient declines   Laboratory: none  Radiology: none  Assessment: pt stable  Plan:  1. History of breast abscess  2. Exposure to sexually transmitted disease (STD)  3. Screen for STD (sexually transmitted disease) - Cervicovaginal ancillary only - Hepatitis B surface antigen - Hepatitis C antibody - HIV Antibody (routine testing w rflx) - RPR  4. Galactorrhea Basic labs and imaging - TSH - Beta hCG quant (ref  lab) - Prolactin - MM DIAG BREAST TOMO BILATERAL; Future - US BREAST COMPLETE UNI LEFT INC AXILLA; Future - US BREAST COMPLETE UNI RIGHT INC AXILLA; Future  5. Well woman exam (no gynecological exam) OCP refills sent in  - Cervicovaginal ancillary only - Hepatitis B surface antigen - Hepatitis C antibody - HIV Antibody (routine testing w rflx) - RPR - TSH - Beta hCG quant (ref lab) - Prolactin  Orders Placed This Encounter  Procedures   MM DIAG BREAST TOMO BILATERAL   US BREAST COMPLETE UNI LEFT INC AXILLA   US BREAST COMPLETE UNI RIGHT INC AXILLA   Hepatitis B surface antigen   Hepatitis C antibody   HIV Antibody (routine testing w rflx)   RPR   TSH   Beta hCG quant (ref lab)   Prolactin    RTC f/u after imaging and labs  Durene Romans MD Attending Center for [employer] farm manager)

## 2021-04-17 ENCOUNTER — Encounter: Payer: Self-pay | Admitting: Obstetrics and Gynecology

## 2021-04-17 ENCOUNTER — Telehealth: Payer: Self-pay

## 2021-04-17 ENCOUNTER — Other Ambulatory Visit: Payer: Self-pay | Admitting: Obstetrics and Gynecology

## 2021-04-17 ENCOUNTER — Other Ambulatory Visit: Payer: Self-pay

## 2021-04-17 DIAGNOSIS — N760 Acute vaginitis: Secondary | ICD-10-CM

## 2021-04-17 DIAGNOSIS — R7989 Other specified abnormal findings of blood chemistry: Secondary | ICD-10-CM | POA: Insufficient documentation

## 2021-04-17 DIAGNOSIS — B9689 Other specified bacterial agents as the cause of diseases classified elsewhere: Secondary | ICD-10-CM

## 2021-04-17 LAB — BETA HCG QUANT (REF LAB): hCG Quant: <1 m[IU]/mL

## 2021-04-17 LAB — CERVICOVAGINAL ANCILLARY ONLY
Bacterial Vaginitis (gardnerella): POSITIVE — AB
Candida Glabrata: NEGATIVE
Candida Vaginitis: NEGATIVE
Chlamydia: NEGATIVE
Comment: NEGATIVE
Comment: NEGATIVE
Comment: NEGATIVE
Comment: NEGATIVE
Comment: NEGATIVE
Comment: NORMAL
Neisseria Gonorrhea: NEGATIVE
Trichomonas: NEGATIVE

## 2021-04-17 LAB — TSH: TSH: 1.21 u[IU]/mL (ref 0.450–4.500)

## 2021-04-17 LAB — HEPATITIS C ANTIBODY: Hep C Virus Ab: <0.1 s/co ratio (ref 0.0–0.9)

## 2021-04-17 LAB — PROLACTIN: Prolactin: 74.3 ng/mL — ABNORMAL HIGH (ref 4.8–23.3)

## 2021-04-17 LAB — RPR: RPR Ser Ql: NONREACTIVE

## 2021-04-17 LAB — HIV ANTIBODY (ROUTINE TESTING W REFLEX): HIV Screen 4th Generation wRfx: NONREACTIVE

## 2021-04-17 LAB — HEPATITIS B SURFACE ANTIGEN: Hepatitis B Surface Ag: NEGATIVE

## 2021-04-17 MED ORDER — METRONIDAZOLE 500 MG PO TABS: 500.0000 mg | ORAL_TABLET | Freq: Two times a day (BID) | ORAL | 0 refills | Status: DC

## 2021-04-17 MED ORDER — NORGESTIMATE-ETH ESTRADIOL 0.25-35 MG-MCG PO TABS: 1.0000 | ORAL_TABLET | Freq: Every day | ORAL | 3 refills | Status: DC

## 2021-04-17 NOTE — Addendum Note (Signed)
Addended by: Aletha Halim on: 04/17/2021 02:23 PM   Modules accepted: Orders

## 2021-04-17 NOTE — Addendum Note (Signed)
Addended by: Aletha Halim on: 04/17/2021 01:02 PM   Modules accepted: Orders

## 2021-04-17 NOTE — Telephone Encounter (Signed)
Pt given lab results and lab appt made for Friday.  Pt also [facility]  Rx sent

## 2021-04-19 ENCOUNTER — Other Ambulatory Visit: Payer: Self-pay

## 2021-04-19 ENCOUNTER — Other Ambulatory Visit: Payer: 59

## 2021-04-19 DIAGNOSIS — R7989 Other specified abnormal findings of blood chemistry: Secondary | ICD-10-CM

## 2021-04-20 LAB — PROLACTIN: Prolactin: 78.7 ng/mL — ABNORMAL HIGH (ref 4.8–23.3)

## 2021-04-22 ENCOUNTER — Telehealth: Payer: Self-pay | Admitting: Obstetrics and Gynecology

## 2021-04-22 DIAGNOSIS — R7989 Other specified abnormal findings of blood chemistry: Secondary | ICD-10-CM

## 2021-04-22 DIAGNOSIS — N643 Galactorrhea not associated with childbirth: Secondary | ICD-10-CM

## 2021-04-22 NOTE — Telephone Encounter (Signed)
GYN Telephone note Patient called and d/w her re: still elevated with fasting 78 for her PRL (random was 74).    I d/w her I recommend endocrine referral which she is amenable to, which we will set up  Durene Romans MD Attending Center for [place] (Faculty Practice) 04/22/2021 Time: 1054am

## 2021-04-26 ENCOUNTER — Other Ambulatory Visit: Payer: Self-pay | Admitting: Obstetrics and Gynecology

## 2021-04-26 ENCOUNTER — Ambulatory Visit
Admission: RE | Admit: 2021-04-26 | Discharge: 2021-04-26 | Disposition: A | Discharge status: Home / Self Care | Payer: 59 | Source: Ambulatory Visit | Attending: Obstetrics and Gynecology | Admitting: Obstetrics and Gynecology

## 2021-04-26 ENCOUNTER — Other Ambulatory Visit: Payer: Self-pay

## 2021-04-26 DIAGNOSIS — N643 Galactorrhea not associated with childbirth: Secondary | ICD-10-CM

## 2021-05-03 ENCOUNTER — Other Ambulatory Visit: Payer: 59

## 2021-05-07 ENCOUNTER — Ambulatory Visit
Admission: RE | Admit: 2021-05-07 | Discharge: 2021-05-07 | Disposition: A | Discharge status: Home / Self Care | Payer: 59 | Source: Ambulatory Visit | Attending: Obstetrics and Gynecology | Admitting: Obstetrics and Gynecology

## 2021-05-07 ENCOUNTER — Other Ambulatory Visit: Payer: Self-pay | Admitting: Obstetrics and Gynecology

## 2021-05-07 ENCOUNTER — Other Ambulatory Visit (HOSPITAL_COMMUNITY)
Admission: RE | Admit: 2021-05-07 | Discharge: 2021-05-07 | Disposition: A | Discharge status: Home / Self Care | Payer: 59 | Source: Ambulatory Visit | Attending: Diagnostic Radiology | Admitting: Diagnostic Radiology

## 2021-05-07 ENCOUNTER — Other Ambulatory Visit: Payer: Self-pay

## 2021-05-07 DIAGNOSIS — N6310 Unspecified lump in the right breast, unspecified quadrant: Secondary | ICD-10-CM

## 2021-05-07 DIAGNOSIS — N643 Galactorrhea not associated with childbirth: Secondary | ICD-10-CM

## 2021-05-07 DIAGNOSIS — R590 Localized enlarged lymph nodes: Secondary | ICD-10-CM | POA: Insufficient documentation

## 2021-05-09 LAB — SURGICAL PATHOLOGY

## 2021-07-26 ENCOUNTER — Encounter: Payer: Self-pay | Admitting: Internal Medicine

## 2021-07-29 ENCOUNTER — Ambulatory Visit (INDEPENDENT_AMBULATORY_CARE_PROVIDER_SITE_OTHER): Payer: 59 | Admitting: Internal Medicine

## 2021-07-29 ENCOUNTER — Encounter: Payer: Self-pay | Admitting: Internal Medicine

## 2021-07-29 ENCOUNTER — Other Ambulatory Visit: Payer: Self-pay

## 2021-07-29 VITALS — BP 120/78 | HR 76 | Ht 62.0 in | Wt 203.6 lb

## 2021-07-29 DIAGNOSIS — E221 Hyperprolactinemia: Secondary | ICD-10-CM | POA: Diagnosis not present

## 2021-07-29 DIAGNOSIS — D352 Benign neoplasm of pituitary gland: Secondary | ICD-10-CM | POA: Diagnosis not present

## 2021-07-29 LAB — PROLACTIN: Prolactin: 44.7 ng/mL — ABNORMAL HIGH

## 2021-07-29 NOTE — Patient Instructions (Addendum)
Please stop at the lab.  Please come back for a follow-up appointment in 6 months.  

## 2021-07-29 NOTE — Progress Notes (Addendum)
Patient ID: Bailey Mendoza, female   DOB: 1986-05-10, 35 y.o.   MRN: [id number]  This visit occurred during the SARS-CoV-2 public health emergency.  Safety protocols were in place, including screening questions prior to the visit, additional usage of staff PPE, and extensive cleaning of exam room while observing appropriate contact time as indicated for disinfecting solutions.   HPI  Bailey Mendoza is a 35 y.o.-year-old female, referred by  Dr. Aletha Halim, for evaluation for hyperprolactinemia and galactorrhea.  Pt. has been found to have a high prolactin level in 03/2021. At that time, she presented with spontaneous galactorrhea L>R, which was worsening in the previous months, but retrospectively, for few years, she had galactorrhea with stimulation.  She had mammogram (04/26/2021) that showed an enlarged lymph node in the right axilla >> Bx with flow cytometry (05/07/2021): Benign.  She does have a history of R breast abscess - 2020 and 2021.  Of note, she was on OCPs - she stopped these 05/2021.     No plans for pregnancy.  At this visit, she mentions galactorrhea but denies: -worsening headaches -irregular menstrual cycles - she has light cycles but monthly -peripheral vision loss -she has blurry vision at far site - more in the right eye -hirsutism  She did have some weight gain, possibly also related to working from home.  Patient prolactin levels were reviewed: Lab Results  Component Value Date   PROLACTIN 78.7 (H) 04/19/2021   PROLACTIN 74.3 (H) 04/16/2021   No history of hypo or hyperthyroidism.  Patient's thyroid tests were also reviewed and these were normal: Lab Results  Component Value Date   TSH 1.210 04/16/2021   TSH 1.350 10/26/2019   TSH 2.227 04/28/2013   TSH 2.135 05/21/2009    She is not on Risperdal, Reglan. She is on vitamins with: Iodine, kelp, black cohosh, yam, dong quai.  She was previously on Nexplanon.  She did not have menstrual cycles on  this.  No previous pregnancies.  ROS: Constitutional: + weight gain,  + fatigue, + subjective hyperthermia, + subjective hypothermia, + nocturia, + excessive urination Eyes: + blurry vision, no xerophthalmia ENT: no sore throat, no nodules felt in neck, no dysphagia, no odynophagia, no hoarseness, no tinnitus, no hypoacusis Cardiovascular: no CP, no SOB, + palpitations, no leg swelling Respiratory: no cough, no SOB, no wheezing Gastrointestinal: no N, no V, no D, no C, no acid reflux Musculoskeletal: no muscle, no joint aches Skin: no rash, no hair loss, + easy bruising Neurological: no tremors, no numbness or tingling/no dizziness/+ occasional HAs Psychiatric: no depression, no anxiety  History reviewed. No pertinent past medical history. Past Surgical History:  Procedure Laterality Date   BREAST BIOPSY Right 2019   Social History   Socioeconomic History   Marital status: Single    Spouse name: Not on file   Number of children: 0   Years of education: Not on file   Highest education level: Not on file  Occupational History   Occupation: Administrator  Tobacco Use   Smoking status: Never   Smokeless tobacco: Never  Substance and Sexual Activity   Alcohol use: Yes    Comment: social, wine, 1-3 drinks a month   Drug use: No   Sexual activity: Yes    Partners: Male    Birth control/protection: Pill  Other Topics Concern   Not on file  Social History Narrative   Not on file   Social Determinants of Health   Financial Resource Strain: Not  on file  Food Insecurity: Not on file  Transportation Needs: Not on file  Physical Activity: Not on file  Stress: Not on file  Social Connections: Not on file  Intimate Partner Violence: Not on file   Current Outpatient Medications on File Prior to Visit  Medication Sig Dispense Refill   benzonatate (TESSALON) 100 MG capsule Take 1 capsule (100 mg total) by mouth every 8 (eight) hours. (Patient not taking: Reported on 04/16/2021) 21  capsule 0   metroNIDAZOLE (FLAGYL) 500 MG tablet Take 1 tablet (500 mg total) by mouth 2 (two) times daily. 14 tablet 0   norgestimate-ethinyl estradiol (ESTARYLLA) 0.25-35 MG-MCG tablet Take 1 tablet by mouth daily. 90 tablet 3   No current facility-administered medications on file prior to visit.   No Known Allergies Family History  Problem Relation Age of Onset   Hypertension Mother    Diabetes Maternal Grandfather    Diabetes Paternal Grandfather    Breast cancer Paternal Aunt    PE: BP 120/78 (BP Location: Right Arm, Patient Position: Sitting, Cuff Size: Normal)   Pulse 76   Ht 5\' 2"  (1.575 m)   Wt 203 lb 9.6 oz (92.4 kg)   SpO2 99%   BMI 37.24 kg/m  Wt Readings from Last 3 Encounters:  07/29/21 203 lb 9.6 oz (92.4 kg)  04/16/21 195 lb 12.8 oz (88.8 kg)  10/26/19 189 lb 6.4 oz (85.9 kg)   Constitutional: overweight, in NAD Eyes: PERRLA, EOMI, no exophthalmos ENT: moist mucous membranes, no thyromegaly, no cervical lymphadenopathy Cardiovascular: RRR, No MRG Respiratory: CTA B Musculoskeletal: no deformities, strength intact in all 4 Skin: moist, warm, no rashes Neurological: no tremor with outstretched hands, DTR normal in all 4  ASSESSMENT: 1.  Hyperprolactinemia Patient with new diagnosis of hyperprolactinemia in the setting of galactorrhea. - I discussed with the patient about possible etiologies of high prolactin levels: Pituitary microadenoma secreting prolactin (discussed that these are not brain tumors and usually benign and treatable), but also: Pregnancy (mentions that there is no possibility she would be pregnant) OCPs (she was on these at the time of the prolactin check, now off for ~1.5 mo) Other supplements (she is on vitamins with dong quai and other herbal supplements and I advised her to stop these during investigation for the high prolactin -not taken today) Hypothyroidism  (recent TFTs were normal) Stress  Exercise  (she does cardio and strength  exercises daily, but not excessively) Lack of sleep  Medications (patient is not taking psychotropic medications or Reglan) Drugs (denies) Chest wall lesions (denies) Seizures (denies) Liver ds (no history of ~) Kidney disease (no history of ~) A teratoma containing pituitary cells that can develop into prolactinoma (very rarely) Macroprolactin (multimeric prolactin, but explained that this occurs mostly in patients without galactorrhea) idiopathic - high prolactin most frequently impacts menstrual cycles.  She has regular menstrual cycles, but they are light, and they have been her entire life.   - we will recheck prolactin today - If elevated, will go ahead and obtain a pituitary MRI - We discussed about possible treatment if prolactin remains elevated and she has a pituitary adenoma, now that she is off OCPs.  We have 2 options for treatment: Cabergoline and bromocriptine.  Cabergoline has the advantage of being administered usually 1-2 times a week, is better tolerated, and has beneficial effects on pituitary tumor size.  Bromocriptine is taken several times a day, has more side effects, and has no effect on pituitary tumor size.  She agrees with the plan to start cabergoline if still needed - RTC for repeat prolactin level in 2 months if we start cabergoline, and in 6 months for another visit  Component     Latest Ref Rng & Units 07/29/2021  Prolactin     ng/mL 44.7 (H)  Prolactin is still elevated, but lower than before.  Pituitary MRI (08/29/2021): Brain: 6 mm hypoenhancing nodule in the right pituitary gland with internal T1 hyperintensity from proteinaceous material/blood products. No invasion of adjacent structures or suprasellar involvement. Unremarkable chiasm.   No infarction, hemorrhage, hydrocephalus, extra-axial collection or mass lesion. Dilated perivascular space below the left putamen.   Vascular: Normal flow voids.   Skull and upper cervical spine: Normal marrow  signal.   Sinuses/Orbits: Negative.   Other: Adenoid and lateral retropharyngeal lymph node enlargement.   IMPRESSION: 6 mm pituitary micro adenoma. The adenoma contains proteinaceous material/blood products.   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  However, I will have the patient back to check the rest of her pituitary hormones: GH, IGF1, cortisol, ACTH, TFTs.  We cannot check LH and FSH since she is on OCPs. After the above results returned, I would suggest to start Cabergoline 0.25 mg weekly and we will recheck her prolactin level in 2 months.  Component     Latest Ref Rng & Units 09/04/2021  IGF-I, LC/MS     53 - 331 ng/mL 132  Z-Score (Female)     -2.0 - 2.0 SD -0.2  Extra tube recieved        Specimen type recieved      Serum;  TSH     0.35 - 5.50 uIU/mL 1.74  Triiodothyronine,Free,Serum     2.3 - 4.2 pg/mL 3.3  T4,Free(Direct)     0.60 - 1.60 ng/dL 0.90  Cortisol, Plasma     ug/dL 9.5  C206 ACTH     6 - 50 pg/mL 14  Growth Hormone     < OR = 7.1 ng/mL 0.3  Thyroid labs are normal.  We can go ahead starting Cabergoline 0.25 mg weekly and recheck her prolactin in 2 months.  Philemon Kingdom, MD PhD [facility] Endocrinology

## 2021-08-29 ENCOUNTER — Other Ambulatory Visit: Payer: Self-pay

## 2021-08-29 ENCOUNTER — Ambulatory Visit
Admission: RE | Admit: 2021-08-29 | Discharge: 2021-08-29 | Disposition: A | Discharge status: Home / Self Care | Payer: 59 | Source: Ambulatory Visit | Attending: Internal Medicine | Admitting: Internal Medicine

## 2021-08-29 DIAGNOSIS — E221 Hyperprolactinemia: Secondary | ICD-10-CM

## 2021-08-29 MED ORDER — GADOBENATE DIMEGLUMINE 529 MG/ML IV SOLN
9.0000 mL | Freq: Once | INTRAVENOUS | Status: AC | PRN
  Administered 2021-08-29: 9 mL via INTRAVENOUS

## 2021-08-30 MED ORDER — CABERGOLINE 0.5 MG PO TABS: 0.2500 mg | ORAL_TABLET | ORAL | 3 refills | Status: DC

## 2021-08-30 NOTE — Addendum Note (Signed)
Addended by: Philemon Kingdom on: 08/30/2021 05:06 PM   Modules accepted: Orders

## 2021-09-04 ENCOUNTER — Other Ambulatory Visit: Payer: Self-pay

## 2021-09-04 ENCOUNTER — Other Ambulatory Visit (INDEPENDENT_AMBULATORY_CARE_PROVIDER_SITE_OTHER): Payer: 59

## 2021-09-04 DIAGNOSIS — D352 Benign neoplasm of pituitary gland: Secondary | ICD-10-CM | POA: Diagnosis not present

## 2021-09-04 LAB — CORTISOL: Cortisol, Plasma: 9.5 ug/dL

## 2021-09-04 LAB — T3, FREE: T3, Free: 3.3 pg/mL (ref 2.3–4.2)

## 2021-09-04 LAB — T4, FREE: Free T4: 0.9 ng/dL (ref 0.60–1.60)

## 2021-09-04 LAB — TSH: TSH: 1.74 u[IU]/mL (ref 0.35–5.50)

## 2021-09-10 LAB — INSULIN-LIKE GROWTH FACTOR
IGF-I, LC/MS: 132 ng/mL (ref 53–331)
Z-Score (Female): -0.2 SD (ref ?–2.0)

## 2021-09-10 LAB — ACTH: C206 ACTH: 14 pg/mL (ref 6–50)

## 2021-09-10 LAB — GROWTH HORMONE: Growth Hormone: 0.3 ng/mL

## 2021-09-10 LAB — EXTRA SPECIMEN

## 2021-09-10 MED ORDER — CABERGOLINE 0.5 MG PO TABS: 0.2500 mg | ORAL_TABLET | ORAL | 3 refills | Status: DC

## 2021-09-10 NOTE — Addendum Note (Signed)
Addended by: Philemon Kingdom on: 09/10/2021 03:38 PM   Modules accepted: Orders

## 2021-09-12 ENCOUNTER — Encounter: Payer: Self-pay | Admitting: Internal Medicine

## 2021-09-12 DIAGNOSIS — D352 Benign neoplasm of pituitary gland: Secondary | ICD-10-CM

## 2021-09-17 MED ORDER — CABERGOLINE 0.5 MG PO TABS: 0.2500 mg | ORAL_TABLET | ORAL | 3 refills | Status: DC

## 2021-11-27 ENCOUNTER — Other Ambulatory Visit (INDEPENDENT_AMBULATORY_CARE_PROVIDER_SITE_OTHER): Payer: 59

## 2021-11-27 DIAGNOSIS — E221 Hyperprolactinemia: Secondary | ICD-10-CM | POA: Diagnosis not present

## 2021-11-27 DIAGNOSIS — D352 Benign neoplasm of pituitary gland: Secondary | ICD-10-CM

## 2021-11-27 LAB — PROLACTIN: Prolactin: 20.6 ng/mL

## 2022-01-28 ENCOUNTER — Ambulatory Visit (INDEPENDENT_AMBULATORY_CARE_PROVIDER_SITE_OTHER): Payer: 59 | Admitting: Internal Medicine

## 2022-01-28 ENCOUNTER — Encounter: Payer: Self-pay | Admitting: Internal Medicine

## 2022-01-28 VITALS — BP 120/80 | HR 70 | Ht 62.0 in | Wt 194.4 lb

## 2022-01-28 DIAGNOSIS — D352 Benign neoplasm of pituitary gland: Secondary | ICD-10-CM | POA: Diagnosis not present

## 2022-01-28 DIAGNOSIS — E221 Hyperprolactinemia: Secondary | ICD-10-CM

## 2022-01-28 NOTE — Patient Instructions (Signed)
Please stop at the lab.  Continue Cabergoline 0.25 mg weekly.  You should have an endocrinology follow-up appointment in 1 year.

## 2022-01-28 NOTE — Progress Notes (Unsigned)
Patient ID: Bailey Mendoza, female   DOB: [birth year], 36 y.o.   MRN: [id number]  HPI  Bailey Mendoza is a 36 y.o.-year-old female, initially referred by  Dr. Aletha Halim, returning for follow-up for hyperprolactinemia, galactorrhea and pituitary microadenoma.  Last visit 6 months ago.  Interim hx: Since last OV, her galactorrhea improved - now only mild, with stimulation. No HAs, vision pbs, nausea - from Cabergoline. She has dizziness occasionally, after flying - 2x since last visit. She has regular menses.  Reviewed and addended history: Pt. has been found to have a high prolactin level in 03/2021. At that time, she presented with spontaneous galactorrhea L>R, which was worsening in the previous months, but retrospectively, for few years, she had galactorrhea with stimulation.  She had mammogram (04/26/2021) that showed an enlarged lymph node in the right axilla >> Bx with flow cytometry (05/07/2021): Benign.  She does have a history of R breast abscess - 2020 and 2021.  Of note, she was on OCPs - she stopped these 05/2021.     No plans for pregnancy.  Patient prolactin levels were reviewed: Lab Results  Component Value Date   PROLACTIN 20.6 11/27/2021   PROLACTIN 44.7 (H) 07/29/2021   PROLACTIN 78.7 (H) 04/19/2021   PROLACTIN 74.3 (H) 04/16/2021   The rest of her pituitary hormones were normal: Component     Latest Ref Rng & Units 09/04/2021  IGF-I, LC/MS     53 - 331 ng/mL 132  Z-Score (Female)     -2.0 - 2.0 SD -0.2  Cortisol, Plasma     ug/dL 9.5  C206 ACTH     6 - 50 pg/mL 14  Growth Hormone     < OR = 7.1 ng/mL 0.3  We could not check LH, FSH, estrogen, as she was on OCPs.  No history of hypo or hyperthyroidism: Lab Results  Component Value Date   TSH 1.74 09/04/2021   TSH 1.210 04/16/2021   TSH 1.350 10/26/2019   TSH 2.227 04/28/2013   TSH 2.135 05/21/2009   FREET4 0.90 09/04/2021    A pituitary MRI showed a 6 mm pituitary microadenoma, most likely a  prolactinoma: Pituitary MRI (08/29/2021): Brain: 6 mm hypoenhancing nodule in the right pituitary gland with internal T1 hyperintensity from proteinaceous material/blood products. No invasion of adjacent structures or suprasellar involvement. Unremarkable chiasm.   No infarction, hemorrhage, hydrocephalus, extra-axial collection or mass lesion. Dilated perivascular space below the left putamen.   Vascular: Normal flow voids.   Skull and upper cervical spine: Normal marrow signal.   Sinuses/Orbits: Negative.   Other: Adenoid and lateral retropharyngeal lymph node enlargement.   IMPRESSION: 6 mm pituitary micro adenoma. The adenoma contains proteinaceous material/blood products.  We started Cabergoline 0.25 mg weekly.  Prolactin normalized.  At last visit, she mentioned galactorrhea but denied: -worsening headaches -irregular menstrual cycles - light cycles but monthly -peripheral vision loss -she has blurry vision at far site - more in the right eye -hirsutism -weight gain, possibly also related to working from home.  She is not on Risperdal, Reglan. She is on vitamins with: Iodine, kelp, black cohosh, yam, dong quai. She was previously on Nexplanon.  She did not have menstrual cycles on this.  No previous pregnancies.  ROS: + see HPI  No past medical history on file. Past Surgical History:  Procedure Laterality Date   BREAST BIOPSY Right 2019   Social History   Socioeconomic History   Marital status: Single    Spouse  name: Not on file   Number of children: 0   Years of education: Not on file   Highest education level: Not on file  Occupational History   Occupation: analyst  Tobacco Use   Smoking status: Never   Smokeless tobacco: Never  Substance and Sexual Activity   Alcohol use: Yes    Comment: social, wine, 1-3 drinks a month   Drug use: No   Sexual activity: Yes    Partners: Male    Birth control/protection: Pill  Other Topics Concern   Not on file   Social History Narrative   Not on file   Social Determinants of Health   Financial Resource Strain: Not on file  Food Insecurity: Not on file  Transportation Needs: Not on file  Physical Activity: Not on file  Stress: Not on file  Social Connections: Not on file  Intimate Partner Violence: Not on file   Current Outpatient Medications on File Prior to Visit  Medication Sig Dispense Refill   benzonatate (TESSALON) 100 MG capsule Take 1 capsule (100 mg total) by mouth every 8 (eight) hours. (Patient not taking: Reported on 04/16/2021) 21 capsule 0   cabergoline (DOSTINEX) 0.5 MG tablet Take 0.5 tablets (0.25 mg total) by mouth once a week. 6 tablet 3   metroNIDAZOLE (FLAGYL) 500 MG tablet Take 1 tablet (500 mg total) by mouth 2 (two) times daily. 14 tablet 0   norgestimate-ethinyl estradiol (ESTARYLLA) 0.25-35 MG-MCG tablet Take 1 tablet by mouth daily. 90 tablet 3   No current facility-administered medications on file prior to visit.   No Known Allergies Family History  Problem Relation Age of Onset   Hypertension Mother    Diabetes Maternal Grandfather    Diabetes Paternal Grandfather    Breast cancer Paternal Aunt    PE: There were no vitals taken for this visit. Wt Readings from Last 3 Encounters:  07/29/21 203 lb 9.6 oz (92.4 kg)  04/16/21 195 lb 12.8 oz (88.8 kg)  10/26/19 189 lb 6.4 oz (85.9 kg)   Constitutional: overweight, in NAD Eyes: EOMI, no exophthalmos ENT: moist mucous membranes, no neck masses palpated, no cervical lymphadenopathy Cardiovascular: RRR, No MRG Respiratory: CTA B Musculoskeletal: no deformities Skin: moist, warm, no rashes Neurological: no tremor with outstretched hands  ASSESSMENT: 1.  Hyperprolactinemia -Patient was found to have a high prolactin level during investigation for galactorrhea.  At that time she denied headaches and peripheral visual field cuts. -At last visit we repeated the prolactin level and this was lower, but still high  above the target range.  She was on OCPs at that time. -We checked a pituitary MRI and this showed a 6 mm pituitary adenoma -Of note, a mammogram did not show any breast masses.  She had an enlarged axillary lymph node which was biopsied with benign results. -We started Cabergoline 0.25 mg weekly.  She tolerates this well, without headaches,  nausea.  She complains of dizziness especially after flying, which she had to do twice for work since last visit.  We discussed about trying to stay hydrated while traveling, but for now, she agrees to continue the medication. -On the above medication, her prolactin level normalized -We will repeat this today  2.  Pituitary microadenoma -Detected on brain MRI with pituitary protocol on 08/29/2021 -At today's visit, we reviewed these results and I explained that this is not a brain tumor and is most likely a prolactinoma. -Since the tumor is smaller than 1 cm >> a micro rather  than a macroadenoma -We discussed about the fact that these do not require surgery and in fact, Cabergoline can shrink the tumors -No repeat MRI is needed unless prolactin continues to rise despite Cabergoline -I will see the patient back in a year  Needs refills for 1 year.  Philemon Kingdom, MD PhD [facility] Endocrinology

## 2022-01-29 LAB — PROLACTIN: Prolactin: 18.6 ng/mL

## 2022-01-29 MED ORDER — CABERGOLINE 0.5 MG PO TABS: 0.2500 mg | ORAL_TABLET | ORAL | 3 refills | Status: DC

## 2022-03-19 ENCOUNTER — Other Ambulatory Visit: Payer: Self-pay | Admitting: Obstetrics and Gynecology

## 2023-02-09 ENCOUNTER — Ambulatory Visit: Payer: 59 | Admitting: Internal Medicine

## 2023-04-06 ENCOUNTER — Other Ambulatory Visit: Payer: Self-pay | Admitting: Internal Medicine

## 2023-04-06 DIAGNOSIS — D352 Benign neoplasm of pituitary gland: Secondary | ICD-10-CM

## 2023-04-07 ENCOUNTER — Ambulatory Visit: Payer: 59 | Admitting: Internal Medicine

## 2023-05-01 ENCOUNTER — Encounter: Payer: Self-pay | Admitting: Internal Medicine

## 2023-05-01 ENCOUNTER — Ambulatory Visit (INDEPENDENT_AMBULATORY_CARE_PROVIDER_SITE_OTHER): Payer: 59 | Admitting: Internal Medicine

## 2023-05-01 VITALS — BP 118/78 | HR 90 | Ht 62.0 in | Wt 184.2 lb

## 2023-05-01 DIAGNOSIS — E221 Hyperprolactinemia: Secondary | ICD-10-CM

## 2023-05-01 DIAGNOSIS — D352 Benign neoplasm of pituitary gland: Secondary | ICD-10-CM

## 2023-05-01 NOTE — Progress Notes (Unsigned)
Patient ID: Bailey Mendoza, female   DOB: Nov 30, 1985, 37 y.o.   MRN: [id number]  HPI  Bailey Mendoza is a 37 y.o.-year-old female, initially referred by  Dr. Unity Bing, returning for follow-up for hyperprolactinemia and pituitary microadenoma.  Last visit 1 year and 3 months ago.  Interim hx: Before last OV, her galactorrhea improved: only mild, with stimulation.  No galactorrhea now. No HAs, vision pbs, nausea - from Cabergoline.  No dizziness. However, she came off Cabergoline 2 mo ago.  She has regular menses. She lost 10 pounds since last visit.  This was intentional, on [place], from a Weight loss clinic. She stopped the medication after 5 months as she developed acne in the last 2 months. She feels that the acne started before running out of Cabergoline. Menses are occurring monthly. She restarted OCPs for 2 months >> did not feel well on these and stopped. LMP 04/23/2023. At today's visit, upon questioning, she tells me that the weight loss clinic also started her on a medication to speed up her metabolism: liothyronine 5 mcg twice a day!  Reviewed history: Pt. has been found to have a high prolactin level in 03/2021. At that time, she presented with spontaneous galactorrhea L>R, which was worsening in the previous months, but retrospectively, for few years, she had galactorrhea with stimulation.  She had mammogram (04/26/2021) that showed an enlarged lymph node in the right axilla >> Bx with flow cytometry (05/07/2021): Benign.  She does have a history of R breast abscess - 2020 and 2021.  Of note, she was on OCPs - she stopped these 05/2021.     No plans for pregnancy.  Patient prolactin levels were reviewed: Lab Results  Component Value Date   PROLACTIN 18.6 01/28/2022   PROLACTIN 20.6 11/27/2021   PROLACTIN 44.7 (H) 07/29/2021   PROLACTIN 78.7 (H) 04/19/2021   PROLACTIN 74.3 (H) 04/16/2021   The rest of her pituitary hormones were normal: Component     Latest Ref  Rng & Units 09/04/2021  IGF-I, LC/MS     53 - 331 ng/mL 132  Z-Score (Female)     -2.0 - 2.0 SD -0.2  Cortisol, Plasma     ug/dL 9.5  K742 ACTH     6 - 50 pg/mL 14  Growth Hormone     < OR = 7.1 ng/mL 0.3  We could not check LH, FSH, estrogen, as she was on OCPs.  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 No invasion of adjacent structures or suprasellar involvement. Unremarkable chiasm.   IMPRESSION: 6 mm pituitary micro adenoma. The adenoma contains proteinaceous material/blood products.  We started Cabergoline 0.25 mg weekly.  Prolactin normalized.  At last visit, she mentioned galactorrhea but denied: -worsening headaches -irregular menstrual cycles - light cycles but monthly -peripheral vision loss -she had blurry vision at far site - more in the right eye -hirsutism -weight gain, possibly also related to working from home.  She is not on Risperdal, Reglan.  She was previously on Nexplanon.  She did not have menstrual cycles on this.  No previous pregnancies.  ROS: + see HPI  No past medical history on file. Past Surgical History:  Procedure Laterality Date   BREAST BIOPSY Right 2019   Social History   Socioeconomic History   Marital status: Single    Spouse name: Not on file   Number of children: 0   Years of education: Not on file   Highest education level: Not on file  Occupational History   Occupation: Systems developer  Tobacco Use   Smoking status: Never   Smokeless tobacco: Never  Substance and Sexual Activity   Alcohol use: Yes     Comment: social, wine, 1-3 drinks a month   Drug use: No   Sexual activity: Yes    Partners: Male    Birth control/protection: Pill  Other Topics Concern   Not on file  Social History Narrative   Not on file   Social Determinants of Health   Financial Resource Strain: Not on file  Food Insecurity: Low Risk  (11/10/2022)   Received from [facility]   Hunger Vital Sign    Worried About Running Out of Food in the Last Year: Never true    Ran Out of Food in the Last Year: Never true  Transportation Needs: Not on file (11/10/2022)  Physical Activity: Not on file  Stress: Not on file  Social Connections: Not on file  Intimate Partner Violence: Not on file   Current Outpatient Medications on File Prior to Visit  Medication Sig Dispense Refill   cabergoline (DOSTINEX) 0.5 MG tablet Take 0.5 tablets (0.25 mg total) by mouth once a week. 8 tablet 3   No current facility-administered medications on file prior to visit.   No Known Allergies Family History  Problem Relation Age of Onset   Hypertension Mother    Diabetes Maternal Grandfather    Diabetes Paternal Grandfather    Breast cancer Paternal Aunt    PE: BP 118/78   Pulse 90   Ht 5\' 2"  (1.575 m)   Wt 184 lb 3.2 oz (83.6 kg)   SpO2 98%   BMI 33.69 kg/m  Wt Readings from Last 3 Encounters:  05/01/23 184 lb 3.2 oz (83.6 kg)  01/28/22 194 lb 6.4 oz (88.2 kg)  07/29/21 203 lb 9.6 oz (92.4 kg)   Constitutional: overweight, in NAD Eyes: EOMI, no exophthalmos ENT: no neck masses palpated, no cervical lymphadenopathy Cardiovascular: RRR, No MRG Respiratory: CTA B Musculoskeletal: no deformities Skin: no rashes, + acne on face Neurological: no tremor with outstretched hands  ASSESSMENT: 1.  Hyperprolactinemia -Patient with history of high prolactin level during investigation for galactorrhea.  At that time, she denied headaches or peripheral visual field cuts.  A repeat prolactin level was lower, but still high above the  target range, while on OCPs. We checked a pituitary MRI and this showed a 6 mm pituitary adenoma.  Of note, mammogram did not show any breast masses.  She had an enlarged axillary lymph node which was biopsied with benign results.  We started Cabergoline 0.25 mg weekly. -She tolerated the Cabergoline well without headaches, nausea but at last visit she mentioned dizziness especially after flying.  We discussed about trying to stay hydrated while traveling, but she agreed to continue the medication -At last visit (01/2022), prolactin level was normal: 18.6 -At today's visit she tells me that she came off Cabergoline 2 mo ago-ran out. -She did develop acne in the last few months and she is wondering whether Riley Nearing is related to [facility].  I advised her that this would be unlikely, but possibly due to coming off Cabergoline.  Also, upon questioning,she was started on liothyronine by the weight management clinic.  I advised her to start this right away after discussion about the negative consequences of taking thyroid hormones without history of hypothyroidism.  We did discuss that if the prolactin level is normal today, we may need to bring her back between the day 3 and 5 of her menstrual cycle to check LH, FSH, estradiol, and testosterone. -We will repeat the prolactin level today and see if we need to restart  2.  Pituitary microadenoma -Detected on brain MRI with pituitary protocol in 08/2021.  Pituitary hormone workup was normal with the exception of a high prolactin. -We discussed that the tumor was smaller than 1 cm, qualifying as a micro, rather than a macroadenoma -We discussed that these tumors usually do not require surgery and the fact that Cabergoline can reduce the tumor size. She is now off but may need to restart Cabergoline after results return. 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 -No need to repeat MRI  unless prolactin rises -I will see the patient back in a year  Needs refills for 1 year.  Carlus Pavlov, MD PhD [facility] Endocrinology

## 2023-05-01 NOTE — Patient Instructions (Addendum)
Please stop at the lab.  Continue off Cabergoline.  Stop Liothyronine!!!  You should have an endocrinology follow-up appointment in 1 year.

## 2023-05-02 LAB — PROLACTIN: Prolactin: 21.7 ng/mL

## 2024-05-03 ENCOUNTER — Ambulatory Visit: Payer: 59 | Admitting: Internal Medicine
